# Patient Record
Sex: Male | Born: 1985 | Race: White | Hispanic: No | Smoking: Former smoker
Health system: Southern US, Community
[De-identification: ages and names within clinical notes are randomized; demographics above are authoritative.]

## PROBLEM LIST (undated history)

## (undated) DIAGNOSIS — G4730 Sleep apnea, unspecified: Secondary | ICD-10-CM

## (undated) DIAGNOSIS — R519 Headache, unspecified: Secondary | ICD-10-CM

## (undated) DIAGNOSIS — K219 Gastro-esophageal reflux disease without esophagitis: Secondary | ICD-10-CM

## (undated) DIAGNOSIS — G709 Myoneural disorder, unspecified: Secondary | ICD-10-CM

## (undated) DIAGNOSIS — F419 Anxiety disorder, unspecified: Secondary | ICD-10-CM

## (undated) HISTORY — DX: Myoneural disorder, unspecified: G70.9

## (undated) HISTORY — DX: Anxiety disorder, unspecified: F41.9

## (undated) HISTORY — DX: Sleep apnea, unspecified: G47.30

## (undated) HISTORY — DX: Gastro-esophageal reflux disease without esophagitis: K21.9

## (undated) HISTORY — PX: COLONOSCOPY W/ POLYPECTOMY: SHX1380

## (undated) HISTORY — PX: OTHER SURGICAL HISTORY: SHX169

## (undated) HISTORY — PX: WISDOM TOOTH EXTRACTION: SHX21

---

## 2005-10-21 ENCOUNTER — Emergency Department (HOSPITAL_COMMUNITY): Admission: EM | Admit: 2005-10-21 | Discharge: 2005-10-21 | Payer: Self-pay | Admitting: Family Medicine

## 2020-02-21 ENCOUNTER — Other Ambulatory Visit: Payer: Self-pay

## 2020-02-21 ENCOUNTER — Emergency Department: Payer: Self-pay

## 2020-02-21 ENCOUNTER — Emergency Department
Admission: EM | Admit: 2020-02-21 | Discharge: 2020-02-21 | Disposition: A | Discharge status: Home / Self Care | Payer: Self-pay | Attending: Emergency Medicine | Admitting: Emergency Medicine

## 2020-02-21 ENCOUNTER — Encounter: Payer: Self-pay | Admitting: Emergency Medicine

## 2020-02-21 DIAGNOSIS — S43004A Unspecified dislocation of right shoulder joint, initial encounter: Secondary | ICD-10-CM | POA: Insufficient documentation

## 2020-02-21 DIAGNOSIS — Y9301 Activity, walking, marching and hiking: Secondary | ICD-10-CM | POA: Insufficient documentation

## 2020-02-21 DIAGNOSIS — S43014A Anterior dislocation of right humerus, initial encounter: Secondary | ICD-10-CM

## 2020-02-21 DIAGNOSIS — W010XXA Fall on same level from slipping, tripping and stumbling without subsequent striking against object, initial encounter: Secondary | ICD-10-CM | POA: Insufficient documentation

## 2020-02-21 MED ORDER — ONDANSETRON HCL 4 MG/2ML IJ SOLN
INTRAMUSCULAR | Status: AC
  Administered 2020-02-21: 4 mg via INTRAVENOUS
  Filled 2020-02-21: qty 2

## 2020-02-21 MED ORDER — HYDROMORPHONE HCL 1 MG/ML IJ SOLN
INTRAMUSCULAR | Status: AC
  Administered 2020-02-21: 1 mg via INTRAVENOUS
  Filled 2020-02-21: qty 1

## 2020-02-21 MED ORDER — ONDANSETRON HCL 4 MG/2ML IJ SOLN: 4.0000 mg | Freq: Once | INTRAMUSCULAR | Status: AC

## 2020-02-21 MED ORDER — HYDROMORPHONE HCL 1 MG/ML IJ SOLN: 1.0000 mg | Freq: Once | INTRAMUSCULAR | Status: AC

## 2020-02-21 MED ORDER — NAPROXEN 500 MG PO TABS
500.0000 mg | ORAL_TABLET | Freq: Once | ORAL | Status: AC
  Administered 2020-02-21: 500 mg via ORAL
  Filled 2020-02-21: qty 1

## 2020-02-21 NOTE — ED Provider Notes (Signed)
[facility] Emergency Department Provider Note  ____________________________________________  Time seen: Approximately 1:29 AM  I have reviewed the triage vital signs and the nursing notes.   HISTORY  Chief Complaint Fall    HPI Brett Robinson is a 34 y.o. male with no significant past medical history who was in his usual state of health until this evening when he had a trip and fall walking into his house, onto the right shoulder.  Afterward he had severe pain in the right shoulder which is nonradiating, worse with movement, associated with inability to raise the right arm.  No chest pain or shortness of breath, no head injury or neck pain.  Pain is constant and severe.  No alleviating factors.  He is right-hand dominant.  No history of injury to the shoulder in the past.      History reviewed. No pertinent past medical history.   There are no problems to display for this patient.    History reviewed. No pertinent surgical history.   Prior to Admission medications   Not on File     Allergies Patient has no allergy information on record.   History reviewed. No pertinent family history.  Social History Social History   Tobacco Use  . Smoking status: Never Smoker  . Smokeless tobacco: Never Used  Vaping Use  . Vaping Use: Some days  Substance Use Topics  . Alcohol use: Not on file  . Drug use: Not on file    Review of Systems  Constitutional:   No fever or chills.  ENT:   No sore throat. No rhinorrhea. Cardiovascular:   No chest pain or syncope. Respiratory:   No dyspnea or cough. Gastrointestinal:   Negative for abdominal pain, vomiting and diarrhea.  Musculoskeletal:   Right shoulder pain as above All other systems reviewed and are negative except as documented above in ROS and HPI.  ____________________________________________   PHYSICAL EXAM:  VITAL SIGNS: ED Triage Vitals  Enc Vitals Group     BP 02/21/20 0018  (!) 148/108     Pulse Rate 02/21/20 0018 (!) 107     Resp 02/21/20 0018 18     Temp 02/21/20 0018 98.1 F (36.7 C)     Temp src --      SpO2 02/21/20 0018 98 %     Weight 02/21/20 0019 260 lb (117.9 kg)     Height 02/21/20 0019 6' (1.829 m)     Head Circumference --      Peak Flow --      Pain Score 02/21/20 0019 10     Pain Loc --      Pain Edu? --      Excl. in GC? --     Vital signs reviewed, nursing assessments reviewed.   Constitutional:   Alert and oriented. Non-toxic appearance. Eyes:   Conjunctivae are normal. EOMI. ENT      Head:   Normocephalic and atraumatic.      Mouth/Throat:   MMM      Neck:   No meningismus. Full ROM.  No midline spinal tenderness  Cardiovascular:   RRR. Symmetric bilateral radial pulses.  No murmurs. Cap refill less than 2 seconds. Respiratory:   Normal respiratory effort without tachypnea/retractions. Breath sounds are clear and equal bilaterally. No wheezes/rales/rhonchi. Musculoskeletal: Right arm held in abduction and internal rotation.  There is emptiness over the lateral deltoid, humeral head palpated anterior to the glenoid.  Clavicle stable and nontender, scapula nontender.  Right  humerus contiguous throughout its length and nontender. Neurologic:   Normal speech and language.  Motor grossly intact. Intact distal sensation of the right arm. No acute focal neurologic deficits are appreciated.  Skin:    Skin is warm, dry and intact. No rash noted.  No wounds.  ____________________________________________    LABS (pertinent positives/negatives) (all labs ordered are listed, but only abnormal results are displayed) Labs Reviewed - No data to display ____________________________________________   EKG  ____________________________________________    RADIOLOGY  DG Shoulder Right Portable  Result Date: 02/21/2020 CLINICAL DATA:  Pain EXAM: PORTABLE RIGHT SHOULDER COMPARISON:  None. FINDINGS: There is no evidence of fracture or  dislocation. There is no evidence of arthropathy or other focal bone abnormality. Soft tissues are unremarkable. IMPRESSION: Negative. Electronically Signed   By: Katherine Mantle M.D.   On: 02/21/2020 01:12    ____________________________________________   PROCEDURES .Ortho Injury Treatment  Date/Time: 02/21/2020 1:39 AM Performed by: Sharman Cheek, MD Authorized by: Sharman Cheek, MD   Consent:    Consent obtained:  Verbal   Consent given by:  Patient   Risks discussed:  Fracture, irreducible dislocation, recurrent dislocation and stiffnessInjury location: shoulder Location details: right shoulder Injury type: dislocation Dislocation type: anterior Chronicity: new Pre-procedure neurovascular assessment: neurovascularly intact Pre-procedure distal perfusion: normal Pre-procedure neurological function: normal Pre-procedure range of motion: reduced  Anesthesia: Local anesthesia used: no  Patient sedated: NoManipulation performed: yes Reduction method: external rotation Reduction successful: yes X-ray confirmed reduction: yes Immobilization: sling Post-procedure neurovascular assessment: post-procedure neurovascularly intact Post-procedure distal perfusion: normal Post-procedure neurological function: normal Post-procedure range of motion: normal Patient tolerance: patient tolerated the procedure well with no immediate complications      ____________________________________________  CLINICAL IMPRESSION / ASSESSMENT AND PLAN / ED COURSE  Pertinent labs & imaging results that were available during my care of the patient were reviewed by me and considered in my medical decision making (see chart for details).  Brett Robinson was evaluated in Emergency Department on 02/21/2020 for the symptoms described in the history of present illness. He was evaluated in the context of the global COVID-19 pandemic, which necessitated consideration that the patient might be  at risk for infection with the SARS-CoV-2 virus that causes COVID-19. Institutional protocols and algorithms that pertain to the evaluation of patients at risk for COVID-19 are in a state of rapid change based on information released by regulatory bodies including the CDC and federal and state organizations. These policies and algorithms were followed during the patient's care in the ED.     Clinical Course as of Feb 20 137  Caleen Essex Feb 21, 2020  3664 Patient presents with clinically apparent right anterior shoulder dislocation.  Not suspicious for concurrent fracture.  This was able to be reduced at bedside with external rotation and IV dilaudid 1mg  for pain control. Will obtain xray to confirm position / injury   [PS]  0056 Shoulder x-ray image reviewed by me, shows normal location of the right humeral head without any obvious fracture.   [PS]    Clinical Course User Index [PS] , MD     ----------------------------------------- 1:39 AM on 02/21/2020 -----------------------------------------  Radiology report confirms normal appearance of the right shoulder x-ray after bedside reduction.  Stable for discharge home to follow-up with orthopedics. ____________________________________________   FINAL CLINICAL IMPRESSION(S) / ED DIAGNOSES    Final diagnoses:  Anterior shoulder dislocation, right, initial encounter     ED Discharge Orders    None  Portions of this note were generated with dragon dictation software. Dictation errors may occur despite best attempts at proofreading.   Sharman Cheek, MD 02/21/20 0140

## 2020-02-21 NOTE — Discharge Instructions (Signed)
Keep the sling on at all times except bathing for the next week. Ensure that your arm stays down by your side while sleeping, and avoid raising your arm away from your body or above chest height.  Use ice intermittently on your shoulder for the next 24 hours to reduce swelling and inflammation.  Take aleve (naproxen) 500mg  every 12 hours, or ibuprofen 600mg  every 6 hours for the next 2 days and then as needed after that.
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
## 2020-02-21 NOTE — ED Notes (Signed)
Dr. Scotty Court at bedside attempting to reduce right shoulder.

## 2020-10-26 ENCOUNTER — Emergency Department: Payer: Self-pay

## 2020-10-26 ENCOUNTER — Other Ambulatory Visit: Payer: Self-pay

## 2020-10-26 ENCOUNTER — Emergency Department
Admission: EM | Admit: 2020-10-26 | Discharge: 2020-10-26 | Disposition: A | Discharge status: Home / Self Care | Payer: Self-pay | Attending: Emergency Medicine | Admitting: Emergency Medicine

## 2020-10-26 DIAGNOSIS — R202 Paresthesia of skin: Secondary | ICD-10-CM

## 2020-10-26 DIAGNOSIS — R0602 Shortness of breath: Secondary | ICD-10-CM | POA: Insufficient documentation

## 2020-10-26 DIAGNOSIS — M5412 Radiculopathy, cervical region: Secondary | ICD-10-CM | POA: Insufficient documentation

## 2020-10-26 DIAGNOSIS — R200 Anesthesia of skin: Secondary | ICD-10-CM

## 2020-10-26 LAB — BASIC METABOLIC PANEL
Anion gap: 8 (ref 5–15)
BUN: 13 mg/dL (ref 6–20)
CO2: 25 mmol/L (ref 22–32)
Calcium: 9.4 mg/dL (ref 8.9–10.3)
Chloride: 102 mmol/L (ref 98–111)
Creatinine, Ser: 0.93 mg/dL (ref 0.61–1.24)
GFR, Estimated: >60 mL/min (ref >60)
Glucose, Bld: 130 mg/dL — ABNORMAL HIGH (ref 70–99)
Potassium: 4.1 mmol/L (ref 3.5–5.1)
Sodium: 135 mmol/L (ref 135–145)

## 2020-10-26 LAB — CBC
HCT: 42.4 % (ref 39.0–52.0)
Hemoglobin: 14.8 g/dL (ref 13.0–17.0)
MCH: 30.5 pg (ref 26.0–34.0)
MCHC: 34.9 g/dL (ref 30.0–36.0)
MCV: 87.4 fL (ref 80.0–100.0)
Platelets: 255 10*3/uL (ref 150–400)
RBC: 4.85 MIL/uL (ref 4.22–5.81)
RDW: 12.3 % (ref 11.5–15.5)
WBC: 7 10*3/uL (ref 4.0–10.5)
nRBC: 0 % (ref 0.0–0.2)

## 2020-10-26 LAB — TROPONIN I (HIGH SENSITIVITY): Troponin I (High Sensitivity): 3 ng/L (ref <18)

## 2020-10-26 NOTE — Discharge Instructions (Addendum)
Please use ibuprofen/naproxen for continued neck pain and intermittent extremity numbness

## 2020-10-26 NOTE — ED Notes (Signed)
ED Provider at bedside. 

## 2020-10-26 NOTE — ED Triage Notes (Signed)
Pt c/o intermittent numbness to the left arm into the 4th and 5th finger for 'a while', states in the past 24 hrs having SOB, numbness to other parts of the body, pt is tearful in triage with his concerns.

## 2020-10-26 NOTE — ED Provider Notes (Signed)
[facility] Emergency Department Provider Note   ____________________________________________   Event Date/Time   First MD Initiated Contact with Patient 10/26/20 [phone]     (approximate)  I have reviewed the triage vital signs and the nursing notes.   HISTORY  Chief Complaint Shortness of Breath and Numbness    HPI Brett Robinson is a 35 y.o. male who presents for multiple complaints including neck pain, shortness of breath, and left upper extremity numbness  LOCATION: Neck DURATION: "A while" TIMING: Worsening since onset but intermittent SEVERITY: Moderate QUALITY: Aching CONTEXT: Patient states that over the past few months he has been having neck pain with intermittent left upper extremity numbness in the fourth and fifth digit as well as up his arm.  Patient also states that pain in his neck sometimes radiates up over his head and is associated with facial numbness MODIFYING FACTORS: Denies any exacerbating or relieving factors ASSOCIATED SYMPTOMS: Facial numbness, shortness of breath   Per medical record review, past medical history is noncontributory          History reviewed. No pertinent past medical history.  There are no problems to display for this patient.   Past Surgical History:  Procedure Laterality Date   skin graft     tubes in ears      Prior to Admission medications   Not on File    Allergies Patient has no allergy information on record.  No family history on file.  Social History Social History   Tobacco Use   Smoking status: Never   Smokeless tobacco: Never  Vaping Use   Vaping Use: Some days  Substance Use Topics   Alcohol use: Yes   Drug use: Yes    Types: Marijuana    Review of Systems Constitutional: No fever/chills Eyes: No visual changes. ENT: No sore throat. Cardiovascular: Denies chest pain. Respiratory: Endorses intermittent shortness of breath. Gastrointestinal: No abdominal  pain.  No nausea, no vomiting.  No diarrhea. Genitourinary: Negative for dysuria. Musculoskeletal: Negative for acute arthralgias Skin: Negative for rash. Neurological: Endorses intermittent headaches and paresthesias/numbness in left upper extremity as well as face Psychiatric: Negative for suicidal ideation/homicidal ideation   ____________________________________________   PHYSICAL EXAM:  VITAL SIGNS: ED Triage Vitals  Enc Vitals Group     BP 10/26/20 0835 124/66     Pulse Rate 10/26/20 0835 65     Resp 10/26/20 0835 18     Temp 10/26/20 0835 98.7 F (37.1 C)     Temp src --      SpO2 10/26/20 0835 100 %     Weight --      Height --      Head Circumference --      Peak Flow --      Pain Score 10/26/20 0831 0     Pain Loc --      Pain Edu? --      Excl. in GC? --    Constitutional: Alert and oriented. Well appearing and in no acute distress. Eyes: Conjunctivae are normal. PERRL. Head: Atraumatic. Nose: No congestion/rhinnorhea. Mouth/Throat: Mucous membranes are moist. Neck: No stridor.  Positive Spurling test on the left Cardiovascular: Grossly normal heart sounds.  Good peripheral circulation. Respiratory: Normal respiratory effort.  No retractions. Gastrointestinal: Soft and nontender. No distention. Musculoskeletal: No obvious deformities Neurologic:  Normal speech and language. No gross focal neurologic deficits are appreciated. Skin:  Skin is warm and dry. No rash noted. Psychiatric: Mood and affect are  normal. Speech and behavior are normal.  ____________________________________________   LABS (all labs ordered are listed, but only abnormal results are displayed)  Labs Reviewed  BASIC METABOLIC PANEL - Abnormal; Notable for the following components:      Result Value   Glucose, Bld 130 (*)    All other components within normal limits  CBC  TROPONIN I (HIGH SENSITIVITY)  TROPONIN I (HIGH SENSITIVITY)    ____________________________________________  EKG  ED ECG REPORT I, Merwyn Katos, the attending physician, personally viewed and interpreted this ECG.  Date: 10/26/2020 EKG Time: 0830 Rate: 67 Rhythm: normal sinus rhythm QRS Axis: normal Intervals: normal ST/T Wave abnormalities: normal Narrative Interpretation: no evidence of acute ischemia  ____________________________________________  RADIOLOGY  ED MD interpretation: 2 view chest x-ray shows no evidence of acute abnormalities including no pneumonia, pneumothorax, or widened mediastinum  Official radiology report(s): DG Chest 2 View  Result Date: 10/26/2020 CLINICAL DATA:  Shortness of breath. EXAM: CHEST - 2 VIEW COMPARISON:  None. FINDINGS: The lungs are clear without focal pneumonia, edema, pneumothorax or pleural effusion. The cardiopericardial silhouette is within normal limits for size. The visualized bony structures of the thorax show no acute abnormality. IMPRESSION: No active cardiopulmonary disease. Electronically Signed   By: Kennith Center M.D.   On: 10/26/2020 09:03    ____________________________________________   PROCEDURES  Procedure(s) performed (including Critical Care):  .1-3 Lead EKG Interpretation  Date/Time: 10/26/2020 10:10 AM Performed by: Merwyn Katos, MD Authorized by: Merwyn Katos, MD     Interpretation: normal     ECG rate:  62   ECG rate assessment: normal     Rhythm: sinus rhythm     Ectopy: none     Conduction: normal     ____________________________________________   INITIAL IMPRESSION / ASSESSMENT AND PLAN / ED COURSE  As part of my medical decision making, I reviewed the following data within the electronic medical record, if available:  Nursing notes reviewed and incorporated, Labs reviewed, EKG interpreted, Old chart reviewed, Radiograph reviewed and Notes from prior ED visits reviewed and incorporated        + neck pain [+ sensation of paresthesias and  numbness.]  ED Workup: Defer C-Spine imaging given negative by NEXUS criteria  Given History, Exam the patient appears to have a cervical radiculopathy.   Patient appears to be low risk for complications or other emergent conditions such as  anginal equivalent, frank cervical instability, arterial dissection, osteomyelitis, epidural abscess, central cord syndrome, c-spine fracture, other spinal emergencies  Rx: NSAIDs, outpatient physical therapy evaluation and recommendation for home exercises in the interim Disposition: Discharge. The patient has been given strict return precautions and understands the need to follow up within 48 hours with their primary care provider      ____________________________________________   FINAL CLINICAL IMPRESSION(S) / ED DIAGNOSES  Final diagnoses:  Shortness of breath  Cervical radiculopathy  Numbness and tingling in left arm  Left facial numbness     ED Discharge Orders     None        Note:  This document was prepared using Dragon voice recognition software and may include unintentional dictation errors.    Merwyn Katos, MD 10/26/20 1010

## 2020-10-30 ENCOUNTER — Other Ambulatory Visit: Payer: Self-pay

## 2020-10-30 ENCOUNTER — Emergency Department: Payer: Self-pay

## 2020-10-30 ENCOUNTER — Emergency Department
Admission: EM | Admit: 2020-10-30 | Discharge: 2020-10-30 | Disposition: A | Discharge status: Home / Self Care | Payer: Self-pay | Attending: Emergency Medicine | Admitting: Emergency Medicine

## 2020-10-30 ENCOUNTER — Encounter: Payer: Self-pay | Admitting: Emergency Medicine

## 2020-10-30 DIAGNOSIS — R519 Headache, unspecified: Secondary | ICD-10-CM | POA: Insufficient documentation

## 2020-10-30 DIAGNOSIS — M5412 Radiculopathy, cervical region: Secondary | ICD-10-CM | POA: Insufficient documentation

## 2020-10-30 DIAGNOSIS — R202 Paresthesia of skin: Secondary | ICD-10-CM | POA: Insufficient documentation

## 2020-10-30 DIAGNOSIS — R42 Dizziness and giddiness: Secondary | ICD-10-CM | POA: Insufficient documentation

## 2020-10-30 LAB — BASIC METABOLIC PANEL
Anion gap: 9 (ref 5–15)
BUN: 18 mg/dL (ref 6–20)
CO2: 25 mmol/L (ref 22–32)
Calcium: 9.7 mg/dL (ref 8.9–10.3)
Chloride: 104 mmol/L (ref 98–111)
Creatinine, Ser: 0.91 mg/dL (ref 0.61–1.24)
GFR, Estimated: >60 mL/min (ref >60)
Glucose, Bld: 113 mg/dL — ABNORMAL HIGH (ref 70–99)
Potassium: 3.8 mmol/L (ref 3.5–5.1)
Sodium: 138 mmol/L (ref 135–145)

## 2020-10-30 LAB — CBC
HCT: 42.2 % (ref 39.0–52.0)
Hemoglobin: 14.7 g/dL (ref 13.0–17.0)
MCH: 30.3 pg (ref 26.0–34.0)
MCHC: 34.8 g/dL (ref 30.0–36.0)
MCV: 87 fL (ref 80.0–100.0)
Platelets: 253 10*3/uL (ref 150–400)
RBC: 4.85 MIL/uL (ref 4.22–5.81)
RDW: 11.9 % (ref 11.5–15.5)
WBC: 6.4 10*3/uL (ref 4.0–10.5)
nRBC: 0 % (ref 0.0–0.2)

## 2020-10-30 MED ORDER — KETOROLAC TROMETHAMINE 60 MG/2ML IM SOLN
60.0000 mg | Freq: Once | INTRAMUSCULAR | Status: AC
  Administered 2020-10-30: 60 mg via INTRAMUSCULAR
  Filled 2020-10-30: qty 2

## 2020-10-30 MED ORDER — DEXAMETHASONE 6 MG PO TABS
10.0000 mg | ORAL_TABLET | Freq: Once | ORAL | Status: AC
  Administered 2020-10-30: 10 mg via ORAL
  Filled 2020-10-30: qty 1

## 2020-10-30 MED ORDER — PROCHLORPERAZINE EDISYLATE 10 MG/2ML IJ SOLN
10.0000 mg | Freq: Once | INTRAMUSCULAR | Status: AC
  Administered 2020-10-30: 10 mg via INTRAMUSCULAR
  Filled 2020-10-30: qty 2

## 2020-10-30 MED ORDER — DIPHENHYDRAMINE HCL 50 MG/ML IJ SOLN
25.0000 mg | Freq: Once | INTRAMUSCULAR | Status: AC
  Administered 2020-10-30: 25 mg via INTRAMUSCULAR
  Filled 2020-10-30: qty 1

## 2020-10-30 MED ORDER — METHYLPREDNISOLONE 4 MG PO TBPK: ORAL_TABLET | ORAL | 0 refills | Status: DC

## 2020-10-30 MED ORDER — CYCLOBENZAPRINE HCL 10 MG PO TABS: 10.0000 mg | ORAL_TABLET | Freq: Three times a day (TID) | ORAL | 0 refills | Status: DC | PRN

## 2020-10-30 MED ORDER — IBUPROFEN 600 MG PO TABS: 600.0000 mg | ORAL_TABLET | Freq: Three times a day (TID) | ORAL | 0 refills | Status: DC | PRN

## 2020-10-30 NOTE — ED Provider Notes (Signed)
[facility] Emergency Department Provider Note  ____________________________________________   Event Date/Time   First MD Initiated Contact with Patient 10/30/20 [phone]     (approximate)  I have reviewed the triage vital signs and the nursing notes.   HISTORY  Chief Complaint Dizziness    HPI Brett Robinson is a 35 y.o. male here with intermittent left sided numbness and occasional right arm numbness.  The patient states that for the last several months, he has had progressively worsening intermittent, numbness shooting down his left arm along the ulnar aspect of it.  This is been progressively worsening.  He has some occasional associated upper neck pain with this.  He is also noticed some occasional radiation down to his leg, as well as some transient symptoms in his right arm.  He also has associated moderate, generalized, throbbing, headache.  He states he has had some dizziness as well, though he has been able to walk and has had no nausea or vomiting.  Denies any head trauma.  No history of migraines, though he does have a history of fairly regular headaches but has not seen a neurologist for this.  No vision changes.  No personal family history of multiple sclerosis.  No recent medication changes.  No other complaints.    History reviewed. No pertinent past medical history.  There are no problems to display for this patient.   Past Surgical History:  Procedure Laterality Date   skin graft     tubes in ears      Prior to Admission medications   Medication Sig Start Date End Date Taking? Authorizing Provider  cyclobenzaprine (FLEXERIL) 10 MG tablet Take 1 tablet (10 mg total) by mouth 3 (three) times daily as needed for muscle spasms. 10/30/20  Yes Shaune Pollack, MD  ibuprofen (ADVIL) 600 MG tablet Take 1 tablet (600 mg total) by mouth every 8 (eight) hours as needed for moderate pain. 10/30/20  Yes Shaune Pollack, MD  methylPREDNISolone (MEDROL  DOSEPAK) 4 MG TBPK tablet Take as directed (21 tab pack). 10/30/20  Yes Shaune Pollack, MD    Allergies Patient has no known allergies.  No family history on file.  Social History Social History   Tobacco Use   Smoking status: Never   Smokeless tobacco: Never  Vaping Use   Vaping Use: Some days  Substance Use Topics   Alcohol use: Yes   Drug use: Yes    Types: Marijuana    Review of Systems  Review of Systems  Constitutional:  Positive for fatigue. Negative for chills and fever.  HENT:  Negative for sore throat.   Respiratory:  Negative for shortness of breath.   Cardiovascular:  Negative for chest pain.  Gastrointestinal:  Negative for abdominal pain.  Genitourinary:  Negative for flank pain.  Musculoskeletal:  Positive for neck pain and neck stiffness.  Skin:  Negative for rash and wound.  Allergic/Immunologic: Negative for immunocompromised state.  Neurological:  Positive for weakness and numbness.  Hematological:  Does not bruise/bleed easily.    ____________________________________________  PHYSICAL EXAM:      VITAL SIGNS: ED Triage Vitals  Enc Vitals Group     BP 10/30/20 0543 131/82     Pulse Rate 10/30/20 0543 62     Resp 10/30/20 0543 16     Temp 10/30/20 0543 98.8 F (37.1 C)     Temp Source 10/30/20 0543 Oral     SpO2 10/30/20 0543 98 %     Weight 10/30/20  0559 260 lb (117.9 kg)     Height 10/30/20 0559 6' (1.829 m)     Head Circumference --      Peak Flow --      Pain Score 10/30/20 0559 6     Pain Loc --      Pain Edu? --      Excl. in GC? --      Physical Exam Vitals and nursing note reviewed.  Constitutional:      General: He is not in acute distress.    Appearance: He is well-developed.  HENT:     Head: Normocephalic and atraumatic.  Eyes:     Conjunctiva/sclera: Conjunctivae normal.  Cardiovascular:     Rate and Rhythm: Normal rate and regular rhythm.     Heart sounds: Normal heart sounds. No murmur heard.   No friction rub.   Pulmonary:     Effort: Pulmonary effort is normal. No respiratory distress.     Breath sounds: Normal breath sounds. No wheezing or rales.  Abdominal:     General: There is no distension.     Palpations: Abdomen is soft.     Tenderness: There is no abdominal tenderness.  Musculoskeletal:     Cervical back: Neck supple.  Skin:    General: Skin is warm.     Capillary Refill: Capillary refill takes less than 2 seconds.  Neurological:     Mental Status: He is alert and oriented to person, place, and time.     Motor: No abnormal muscle tone.     Comments: Strength out of 5 bilateral upper and lower extremities proximally and distally.  Grip strength intact.  Normal sensation to light touch bilateral upper and lower extremities.  Cranial nerves intact.  No nystagmus.  Finger-to-nose testing normal.  Gait normal.  Negative Romberg.      ____________________________________________   LABS (all labs ordered are listed, but only abnormal results are displayed)  Labs Reviewed  BASIC METABOLIC PANEL - Abnormal; Notable for the following components:      Result Value   Glucose, Bld 113 (*)    All other components within normal limits  CBC    ____________________________________________  EKG: Sinus bradycardia with sinus arrhythmia.  Ventricular rate 56, PR 160, QRS 104, QTc 372.  No acute ST elevations or depressions. ________________________________________  RADIOLOGY All imaging, including plain films, CT scans, and ultrasounds, independently reviewed by me, and interpretations confirmed via formal radiology reads.  ED MD interpretation:   CT head: Normal head CT CT cervical spine: Straightening of the cervical lordosis likely due to muscle spasm, degenerative disc disease at C4-C5 and C6-C7  Official radiology report(s): CT Cervical Spine Wo Contrast  Result Date: 10/30/2020 CLINICAL DATA:  Cervical radiculopathy, no red flags EXAM: CT CERVICAL SPINE WITHOUT CONTRAST TECHNIQUE:  Multidetector CT imaging of the cervical spine was performed without intravenous contrast. Multiplanar CT image reconstructions were also generated. COMPARISON:  None. FINDINGS: Alignment: Facet joints are aligned without dislocation or traumatic listhesis. Dens and lateral masses are aligned. Straightening with slight reversal of the cervical lordosis. Skull base and vertebrae: No acute fracture. No primary bone lesion or focal pathologic process. Soft tissues and spinal canal: No prevertebral fluid or swelling. No visible canal hematoma. Disc levels: Intervertebral disc heights are preserved. Mild endplate and uncovertebral joint spurring at the C4-5 and C6-7 levels. There is a spur projecting into the right C4-5 neural foramen (series 6, image 27). No significant facet joint arthropathy. Upper chest: Included lung  apices are clear. Other: 1.6 cm right thyroid lobe nodule. IMPRESSION: 1. No acute fracture or traumatic listhesis of the cervical spine. 2. Straightening with slight reversal of the cervical lordosis may be positional or secondary to muscle spasm. 3. Mild degenerative disc disease at C4-5 and C6-7 levels. Suspected right-sided foraminal stenosis at the C4-5 level secondary to uncovertebral spurring. 4. Incidentally noted 1.6 cm right thyroid lobe nodule. Recommend nonemergent thyroid US (ref: J Am Coll Radiol. 2015 Feb;12(2): 143-50). Electronically Signed   By: Duanne GuessNicholas  Plundo D.O.   On: 10/30/2020 10:45    ____________________________________________  PROCEDURES   Procedure(s) performed (including Critical Care):  Procedures  ____________________________________________  INITIAL IMPRESSION / MDM / ASSESSMENT AND PLAN / ED COURSE  As part of my medical decision making, I reviewed the following data within the electronic MEDICAL RECORD NUMBER Nursing notes reviewed and incorporated, Old chart reviewed, Notes from prior ED visits, and  Controlled Substance Database       *Brett DikeChristopher  Robinson was evaluated in Emergency Department on 10/30/2020 for the symptoms described in the history of present illness. He was evaluated in the context of the global COVID-19 pandemic, which necessitated consideration that the patient might be at risk for infection with the SARS-CoV-2 virus that causes COVID-19. Institutional protocols and algorithms that pertain to the evaluation of patients at risk for COVID-19 are in a state of rapid change based on information released by regulatory bodies including the CDC and federal and state organizations. These policies and algorithms were followed during the patient's care in the ED.  Some ED evaluations and interventions may be delayed as a result of limited staffing during the pandemic.*     Medical Decision Making: 35 year old male here with left arm numbness and transient right hand symptoms.  Suspect cervical radiculopathy, differential includes atypical migraine, peripheral neuropathy, anxiety.  Patient has no weakness or signs to suggest significant cord compromise.  He has no focal neurological deficits to suggest significant intracranial mass or lesion.  That being said, given the persistence of his symptoms and absence of any previous imaging, will obtain a CT of the head and C-spine.  Will treat for possible complex migraine.  Patient is otherwise afebrile and hemodynamically stable.  No leukocytosis or signs of infectious etiology.  CT of the cervical spine shows degenerative disease of C4-C5 and C6-C7, which fits clinically with his symptoms.  He may also have a component of muscle spasm.  I discussed this with him and encouraged follow-up with outpatient neurosurgery.  Incidental thyroid nodule noted, will recommend follow-up for this as well.  Otherwise, patient will be treated with steroids, analgesia, and outpatient follow-up.  No weakness or signs of impending significant cord compromise at this  time.  ____________________________________________  FINAL CLINICAL IMPRESSION(S) / ED DIAGNOSES  Final diagnoses:  Dizziness  Cervical radiculopathy     MEDICATIONS GIVEN DURING THIS VISIT:  Medications  ketorolac (TORADOL) injection 60 mg (60 mg Intramuscular Given 10/30/20 1025)  prochlorperazine (COMPAZINE) injection 10 mg (10 mg Intramuscular Given 10/30/20 1026)  diphenhydrAMINE (BENADRYL) injection 25 mg (25 mg Intramuscular Given 10/30/20 1026)  dexamethasone (DECADRON) tablet 10 mg (10 mg Oral Given 10/30/20 1026)     ED Discharge Orders          Ordered    methylPREDNISolone (MEDROL DOSEPAK) 4 MG TBPK tablet        10/30/20 1123    ibuprofen (ADVIL) 600 MG tablet  Every 8 hours PRN  10/30/20 1123    cyclobenzaprine (FLEXERIL) 10 MG tablet  3 times daily PRN        10/30/20 1123             Note:  This document was prepared using Dragon voice recognition software and may include unintentional dictation errors.   Shaune Pollack, MD 10/30/20 [phone]

## 2020-10-30 NOTE — ED Notes (Signed)
Reviewed pt's cc and prev visit with Dr York Cerise; verbal orders obtained

## 2020-10-30 NOTE — ED Notes (Signed)
PT reports being seen recently and was told he had a pinched nerve.  Pt states headaches continue and that numbness has spread.  Pt has not had a follow up since being seen in ED.

## 2020-10-30 NOTE — ED Triage Notes (Signed)
Patient ambulatory to triage with steady gait, without difficulty or distress noted; pt reports occipital HA accomp by nausea and dizziness and "tingling" to left side of body that has been going on seen last wk; seen here several days ago with no abnormal findings

## 2020-11-25 DIAGNOSIS — G589 Mononeuropathy, unspecified: Secondary | ICD-10-CM | POA: Insufficient documentation

## 2020-12-08 ENCOUNTER — Other Ambulatory Visit: Payer: Self-pay | Admitting: Neurosurgery

## 2020-12-08 ENCOUNTER — Other Ambulatory Visit (HOSPITAL_COMMUNITY): Payer: Self-pay | Admitting: Neurosurgery

## 2020-12-08 DIAGNOSIS — M5412 Radiculopathy, cervical region: Secondary | ICD-10-CM

## 2020-12-08 DIAGNOSIS — R42 Dizziness and giddiness: Secondary | ICD-10-CM

## 2020-12-08 DIAGNOSIS — R519 Headache, unspecified: Secondary | ICD-10-CM

## 2020-12-15 ENCOUNTER — Emergency Department
Admission: EM | Admit: 2020-12-15 | Discharge: 2020-12-15 | Disposition: A | Discharge status: Home / Self Care | Payer: Self-pay | Attending: Emergency Medicine | Admitting: Emergency Medicine

## 2020-12-15 ENCOUNTER — Emergency Department: Payer: Self-pay

## 2020-12-15 DIAGNOSIS — R079 Chest pain, unspecified: Secondary | ICD-10-CM | POA: Insufficient documentation

## 2020-12-15 DIAGNOSIS — R42 Dizziness and giddiness: Secondary | ICD-10-CM | POA: Insufficient documentation

## 2020-12-15 LAB — BASIC METABOLIC PANEL
Anion gap: 9 (ref 5–15)
BUN: 15 mg/dL (ref 6–20)
CO2: 24 mmol/L (ref 22–32)
Calcium: 9.7 mg/dL (ref 8.9–10.3)
Chloride: 102 mmol/L (ref 98–111)
Creatinine, Ser: 0.89 mg/dL (ref 0.61–1.24)
GFR, Estimated: >60 mL/min (ref >60)
Glucose, Bld: 118 mg/dL — ABNORMAL HIGH (ref 70–99)
Potassium: 4.1 mmol/L (ref 3.5–5.1)
Sodium: 135 mmol/L (ref 135–145)

## 2020-12-15 LAB — URINALYSIS, COMPLETE (UACMP) WITH MICROSCOPIC
Bacteria, UA: NONE SEEN
Bilirubin Urine: NEGATIVE
Glucose, UA: NEGATIVE mg/dL
Ketones, ur: NEGATIVE mg/dL
Leukocytes,Ua: NEGATIVE
Nitrite: NEGATIVE
Protein, ur: NEGATIVE mg/dL
Specific Gravity, Urine: <1.005 — ABNORMAL LOW (ref 1.005–1.030)
Squamous Epithelial / HPF: NONE SEEN (ref 0–5)
pH: 5.5 (ref 5.0–8.0)

## 2020-12-15 LAB — CBC
HCT: 42.4 % (ref 39.0–52.0)
Hemoglobin: 15.8 g/dL (ref 13.0–17.0)
MCH: 31.7 pg (ref 26.0–34.0)
MCHC: 37.3 g/dL — ABNORMAL HIGH (ref 30.0–36.0)
MCV: 85 fL (ref 80.0–100.0)
Platelets: 294 10*3/uL (ref 150–400)
RBC: 4.99 MIL/uL (ref 4.22–5.81)
RDW: 12.3 % (ref 11.5–15.5)
WBC: 9.9 10*3/uL (ref 4.0–10.5)
nRBC: 0 % (ref 0.0–0.2)

## 2020-12-15 MED ORDER — METOCLOPRAMIDE HCL 10 MG PO TABS
10.0000 mg | ORAL_TABLET | Freq: Once | ORAL | Status: AC
  Administered 2020-12-15: 10 mg via ORAL
  Filled 2020-12-15: qty 1

## 2020-12-15 MED ORDER — MECLIZINE HCL 25 MG PO TABS: 25.0000 mg | ORAL_TABLET | Freq: Three times a day (TID) | ORAL | 0 refills | Status: AC | PRN

## 2020-12-15 MED ORDER — KETOROLAC TROMETHAMINE 30 MG/ML IJ SOLN
15.0000 mg | Freq: Once | INTRAMUSCULAR | Status: AC
  Administered 2020-12-15: 15 mg via INTRAMUSCULAR
  Filled 2020-12-15: qty 1

## 2020-12-15 MED ORDER — MECLIZINE HCL 25 MG PO TABS
25.0000 mg | ORAL_TABLET | Freq: Once | ORAL | Status: AC
  Administered 2020-12-15: 25 mg via ORAL
  Filled 2020-12-15: qty 1

## 2020-12-15 NOTE — ED Provider Notes (Signed)
[facility]  ____________________________________________   Event Date/Time   First MD Initiated Contact with Patient 12/15/20 1645     (approximate)  I have reviewed the triage vital signs and the nursing notes.   HISTORY  Chief Complaint Dizziness (Chest pain/)    HPI Brett Robinson is a 35 y.o. male with no past medical history who presents with ongoing dizziness and intermittent chest pain.  Patient symptoms started over a month ago.  His main complaint is the dizziness.  He describes it as a lightheaded feeling with occasional room spinning sensation.  He notes that it is constant and since several weeks ago has never totally resolved.  It does however become exacerbated when he sits down and when he moves his head.  Says it actually feels better when he gets up and walks around.  He has had associated posterior headache that is intermittent.  He also has intermittent blurry vision and notes that his vision is blurry constantly today.  He denies diplopia, dysarthria or dysphagia.  He has had some intermittent left arm numbness which has been attributed to a cervical radiculopathy he has actually seen neurosurgery about this.  He notes that he feels globally weak and occasionally his whole body feels numb but he denies any new focal numbness or weakness.  He also denies any bowel or bladder difficulty including incontinence or retention.  He also has had intermittent sharp chest pain.  It is not associated with exertion.  It often comes on when his dizziness comes on.  At the same time he also gets dyspnea which is not always associate with the chest pain.  Denies any fevers or chills.  Patient has been seen in the ED twice for the symptoms last month and had a CT of his head, labs including troponin that are normal.  He followed up with his primary care provider who referred him to both neurosurgery and ENT.  He has not yet seen ENT.  He saw neurosurgery who  recommended both an MRI of his brain and C-spine.  He is scheduled to get the MRI of his brain 2 days from now on Thursday of this week.         History reviewed. No pertinent past medical history.  There are no problems to display for this patient.   Past Surgical History:  Procedure Laterality Date   skin graft     tubes in ears      Prior to Admission medications   Medication Sig Start Date End Date Taking? Authorizing Provider  meclizine (ANTIVERT) 25 MG tablet Take 1 tablet (25 mg total) by mouth 3 (three) times daily as needed for up to 10 days for dizziness. 12/15/20 12/25/20 Yes Georga Hacking, MD  cyclobenzaprine (FLEXERIL) 10 MG tablet Take 1 tablet (10 mg total) by mouth 3 (three) times daily as needed for muscle spasms. 10/30/20   Shaune Pollack, MD  ibuprofen (ADVIL) 600 MG tablet Take 1 tablet (600 mg total) by mouth every 8 (eight) hours as needed for moderate pain. 10/30/20   Shaune Pollack, MD  methylPREDNISolone (MEDROL DOSEPAK) 4 MG TBPK tablet Take as directed (21 tab pack). 10/30/20   Shaune Pollack, MD    Allergies Patient has no known allergies.  History reviewed. No pertinent family history.  Social History Social History   Tobacco Use   Smoking status: Never   Smokeless tobacco: Never  Vaping Use   Vaping Use: Some days  Substance Use Topics  Alcohol use: Yes   Drug use: Not Currently    Review of Systems   Review of Systems  Constitutional:  Negative for chills and fever.  Eyes:  Positive for visual disturbance.  Respiratory:  Positive for shortness of breath.   Cardiovascular:  Positive for chest pain.  Genitourinary:  Negative for difficulty urinating.  Musculoskeletal:  Positive for neck pain. Negative for back pain.  Neurological:  Positive for numbness and headaches. Negative for weakness.  All other systems reviewed and are negative.  Physical Exam Updated Vital Signs BP 132/84   Pulse 89   Temp 97.6 F (36.4 C) (Oral)    Resp 18   SpO2 97%   Physical Exam Vitals and nursing note reviewed.  Constitutional:      General: He is not in acute distress.    Appearance: Normal appearance.  HENT:     Head: Normocephalic and atraumatic.  Eyes:     General: No scleral icterus.    Conjunctiva/sclera: Conjunctivae normal.  Pulmonary:     Effort: Pulmonary effort is normal. No respiratory distress.     Breath sounds: Normal breath sounds. No wheezing.  Musculoskeletal:        General: No deformity or signs of injury.     Cervical back: Normal range of motion.  Skin:    Coloration: Skin is not jaundiced or pale.  Neurological:     General: No focal deficit present.     Mental Status: He is alert and oriented to person, place, and time. Mental status is at baseline.     Comments: Visual acuity is 20/20 in the left and 20/25 in the right Extraocular movements intact, face is symmetric, normal tongue movement 5 out of 5 strength in the bilateral upper and lower extremities Sensation grossly normal in the bilateral upper extremities Finger-nose-finger intact Gait is normal without ataxia   Psychiatric:        Mood and Affect: Mood normal.        Behavior: Behavior normal.     LABS (all labs ordered are listed, but only abnormal results are displayed)  Labs Reviewed  BASIC METABOLIC PANEL - Abnormal; Notable for the following components:      Result Value   Glucose, Bld 118 (*)    All other components within normal limits  CBC - Abnormal; Notable for the following components:   MCHC 37.3 (*)    All other components within normal limits  URINALYSIS, COMPLETE (UACMP) WITH MICROSCOPIC - Abnormal; Notable for the following components:   Color, Urine STRAW (*)    Specific Gravity, Urine <1.005 (*)    Hgb urine dipstick TRACE (*)    All other components within normal limits  CBG MONITORING, ED   ____________________________________________  EKG  Normal sinus rhythm, normal intervals, normal axis, no  acute ischemic changes ____________________________________________  RADIOLOGY I, Randol Kern, personally viewed and evaluated these images (plain radiographs) as part of my medical decision making, as well as reviewing the written report by the radiologist.  ED MD interpretation:  n/a    ____________________________________________   PROCEDURES  Procedure(s) performed (including Critical Care):  Procedures   ____________________________________________   INITIAL IMPRESSION / ASSESSMENT AND PLAN / ED COURSE     The patient is a 35 year old male who presents with multiple symptoms which have been going on for over a month.  The thing that is most concerning to him is the dizziness.  It is been constant, somewhat positional but also described both  as lightheadedness and spinning feeling.  He has associated symptoms of blurry vision but no other cranial nerve findings including diplopia dysarthria etc.  He is scheduled to see ENT which I think is appropriate given the associated tinnitus that was noted before.  Patient also was scheduled for an MRI in 2 days which I also think is appropriate to rule out any central cause of his dizziness, such as MS or other central pathology.  I think that his left arm weakness is unrelated and is probably due to the cervical radiculopathy for which she is seeing neurosurgery.  Today his exam is normal he has no concerning nystagmus, normal cerebellar exam including finger-nose and gait and has no objective weakness therefore I do not feel that we need to obtain an emergent MRI today.  I am comforted by the fact that he has an MRI in 2 days and given this has been going on for several weeks I do not feel that delaying a potential diagnosis by 2 days is significant.  I am less concerned about his chest pain and dyspnea given his normal EKG and prior negative work-up as well as symptoms being atypical with normal vital signs.    Patient was given  meclizine as well as Reglan and Toradol and his symptoms improved.  We discussed following up for his MRI as scheduled as well as following up with neurology neurosurgery and ENT.   ____________________________________________   FINAL CLINICAL IMPRESSION(S) / ED DIAGNOSES  Final diagnoses:  Dizziness     ED Discharge Orders          Ordered    meclizine (ANTIVERT) 25 MG tablet  3 times daily PRN        12/15/20 1914             Note:  This document was prepared using Dragon voice recognition software and may include unintentional dictation errors.    Georga Hacking, MD 12/15/20 [id number]

## 2020-12-15 NOTE — Discharge Instructions (Addendum)
Please follow-up with neurosurgery and ENT.  You should also follow-up with neurology.  Please get the MRI of your brain and spine done on Thursday as scheduled.

## 2020-12-15 NOTE — ED Triage Notes (Signed)
Pt presents to ED with c/o of of dizziness and chest pain that has been ongoing for 1 month pt states he has been seen here twice for this. Pt states no PCP.   Pt states new tenderness to L upper inner thigh.   Pt also states blurry vision that was intermittent last night but states the burry vision has stayed since this morning.

## 2020-12-17 ENCOUNTER — Ambulatory Visit
Admission: RE | Admit: 2020-12-17 | Discharge: 2020-12-17 | Disposition: A | Discharge status: Home / Self Care | Payer: Self-pay | Source: Ambulatory Visit | Attending: Neurosurgery | Admitting: Neurosurgery

## 2020-12-17 ENCOUNTER — Other Ambulatory Visit: Payer: Self-pay

## 2020-12-17 DIAGNOSIS — R519 Headache, unspecified: Secondary | ICD-10-CM

## 2020-12-17 DIAGNOSIS — R42 Dizziness and giddiness: Secondary | ICD-10-CM

## 2020-12-17 DIAGNOSIS — M5412 Radiculopathy, cervical region: Secondary | ICD-10-CM | POA: Insufficient documentation

## 2021-02-09 DIAGNOSIS — M940 Chondrocostal junction syndrome [Tietze]: Secondary | ICD-10-CM | POA: Insufficient documentation

## 2021-02-16 ENCOUNTER — Emergency Department: Payer: Self-pay

## 2021-02-16 ENCOUNTER — Encounter: Payer: Self-pay | Admitting: Emergency Medicine

## 2021-02-16 ENCOUNTER — Emergency Department
Admission: EM | Admit: 2021-02-16 | Discharge: 2021-02-16 | Disposition: A | Discharge status: Home / Self Care | Payer: Self-pay | Attending: Emergency Medicine | Admitting: Emergency Medicine

## 2021-02-16 ENCOUNTER — Other Ambulatory Visit: Payer: Self-pay

## 2021-02-16 DIAGNOSIS — R300 Dysuria: Secondary | ICD-10-CM | POA: Insufficient documentation

## 2021-02-16 DIAGNOSIS — R109 Unspecified abdominal pain: Secondary | ICD-10-CM | POA: Insufficient documentation

## 2021-02-16 DIAGNOSIS — R609 Edema, unspecified: Secondary | ICD-10-CM | POA: Insufficient documentation

## 2021-02-16 LAB — CBC WITH DIFFERENTIAL/PLATELET
Abs Immature Granulocytes: 0.02 10*3/uL (ref 0.00–0.07)
Basophils Absolute: 0 10*3/uL (ref 0.0–0.1)
Basophils Relative: 0 %
Eosinophils Absolute: 0.1 10*3/uL (ref 0.0–0.5)
Eosinophils Relative: 1 %
HCT: 41.9 % (ref 39.0–52.0)
Hemoglobin: 14.9 g/dL (ref 13.0–17.0)
Immature Granulocytes: 0 %
Lymphocytes Relative: 19 %
Lymphs Abs: 1.8 10*3/uL (ref 0.7–4.0)
MCH: 31.4 pg (ref 26.0–34.0)
MCHC: 35.6 g/dL (ref 30.0–36.0)
MCV: 88.2 fL (ref 80.0–100.0)
Monocytes Absolute: 0.5 10*3/uL (ref 0.1–1.0)
Monocytes Relative: 5 %
Neutro Abs: 6.9 10*3/uL (ref 1.7–7.7)
Neutrophils Relative %: 75 %
Platelets: 204 10*3/uL (ref 150–400)
RBC: 4.75 MIL/uL (ref 4.22–5.81)
RDW: 11.9 % (ref 11.5–15.5)
WBC: 9.4 10*3/uL (ref 4.0–10.5)
nRBC: 0 % (ref 0.0–0.2)

## 2021-02-16 LAB — URINALYSIS, COMPLETE (UACMP) WITH MICROSCOPIC
Bacteria, UA: NONE SEEN
Bilirubin Urine: NEGATIVE
Glucose, UA: NEGATIVE mg/dL
Ketones, ur: NEGATIVE mg/dL
Leukocytes,Ua: NEGATIVE
Nitrite: NEGATIVE
Protein, ur: NEGATIVE mg/dL
Specific Gravity, Urine: 1.01 (ref 1.005–1.030)
pH: 5 (ref 5.0–8.0)

## 2021-02-16 LAB — HEPATIC FUNCTION PANEL
ALT: 25 U/L (ref 0–44)
AST: 20 U/L (ref 15–41)
Albumin: 5 g/dL (ref 3.5–5.0)
Alkaline Phosphatase: 64 U/L (ref 38–126)
Bilirubin, Direct: 0.2 mg/dL (ref 0.0–0.2)
Indirect Bilirubin: 0.8 mg/dL (ref 0.3–0.9)
Total Bilirubin: 1 mg/dL (ref 0.3–1.2)
Total Protein: 7.5 g/dL (ref 6.5–8.1)

## 2021-02-16 LAB — BASIC METABOLIC PANEL
Anion gap: 9 (ref 5–15)
BUN: 16 mg/dL (ref 6–20)
CO2: 26 mmol/L (ref 22–32)
Calcium: 9.9 mg/dL (ref 8.9–10.3)
Chloride: 102 mmol/L (ref 98–111)
Creatinine, Ser: 0.85 mg/dL (ref 0.61–1.24)
GFR, Estimated: >60 mL/min (ref >60)
Glucose, Bld: 111 mg/dL — ABNORMAL HIGH (ref 70–99)
Potassium: 4.3 mmol/L (ref 3.5–5.1)
Sodium: 137 mmol/L (ref 135–145)

## 2021-02-16 LAB — LIPASE, BLOOD: Lipase: 30 U/L (ref 11–51)

## 2021-02-16 NOTE — ED Triage Notes (Signed)
Pt via POV from home. Pt c/o bilateral flank pain for 3 days and some dysuria that started yesterday. Some diarrhea. Denies NV. Denies fever. Denies hx of kidney stone. Pt is A&OX4 and NAD.

## 2021-02-16 NOTE — ED Provider Notes (Signed)
Emergency Medicine Provider Triage Evaluation Note  Brett Robinson , a 35 y.o. male  was evaluated in triage.  Pt complains of bilateral flank pain for the past 3 days and some dysuria that started yesterday.  Patient also reports some diarrhea.  Patient has mild bilateral lower extremity edema.  He has had some nausea but denies vomiting.  No chest pain, chest tightness or shortness of breath.  Denies history of nephrolithiasis.  Review of Systems  Positive: Patient has flank pain. Negative: No chest pain, chest tightness or abdominal pain.   Physical Exam  BP 137/89 (BP Location: Left Arm)   Pulse 86   Temp 98.4 F (36.9 C) (Oral)   Resp 20   Ht 6\' 1"  (1.854 m)   Wt 112 kg   SpO2 97%   BMI 32.59 kg/m  Gen:   Awake, no distress   Resp:  Normal effort  MSK:   Moves extremities without difficulty  Other:    Medical Decision Making  Medically screening exam initiated at 5:30 PM.  Appropriate orders placed.  Herson Prichard was informed that the remainder of the evaluation will be completed by another provider, this initial triage assessment does not replace that evaluation, and the importance of remaining in the ED until their evaluation is complete.     Lynnell Dike [place], PA-C 02/16/21 1733    13/08/22, MD 02/16/21 1921

## 2021-02-16 NOTE — ED Provider Notes (Signed)
[facility] Emergency Department Provider Note   ____________________________________________   Event Date/Time   First MD Initiated Contact with Patient 02/16/21 2005     (approximate)  I have reviewed the triage vital signs and the nursing notes.   HISTORY  Chief Complaint Flank Pain and Dysuria   HPI Brett Robinson is a 35 y.o. male who reports 3 days of urinating and making a lot of foam and some edema in his legs and bilateral CVA area pain.  He has not had any chest pain or tightness or shortness of breath.  Has not had any other problems.  He is worried about his kidneys.  He has not had any blood in the urine.  He is not urinating frequently he is just making a lot of foam.  He told the triage doctor that he had some dysuria but did not mention that to me.     Past medical history no pertinent past medical history    There are no problems to display for this patient.   Past Surgical History:  Procedure Laterality Date   skin graft     tubes in ears      Prior to Admission medications   Medication Sig Start Date End Date Taking? Authorizing Provider  cyclobenzaprine (FLEXERIL) 10 MG tablet Take 1 tablet (10 mg total) by mouth 3 (three) times daily as needed for muscle spasms. 10/30/20   Duffy Bruce, MD  ibuprofen (ADVIL) 600 MG tablet Take 1 tablet (600 mg total) by mouth every 8 (eight) hours as needed for moderate pain. 10/30/20   Duffy Bruce, MD  methylPREDNISolone (MEDROL DOSEPAK) 4 MG TBPK tablet Take as directed (21 tab pack). 10/30/20   Duffy Bruce, MD    Allergies Celebrex [celecoxib]  History reviewed. No pertinent family history.  Social History Social History   Tobacco Use   Smoking status: Never   Smokeless tobacco: Never  Vaping Use   Vaping Use: Some days  Substance Use Topics   Alcohol use: Yes   Drug use: Not Currently    Review of Systems  Constitutional: No fever/chills Eyes: No visual  changes. ENT: No sore throat. Cardiovascular: Denies chest pain. Respiratory: Denies shortness of breath. Gastrointestinal: No abdominal pain.  No nausea, no vomiting.  No diarrhea.  No constipation. Genitourinary:  dysuria. Musculoskeletal: Negative for back pain. Skin: Negative for rash. Neurological: Negative for headaches, focal weakness   ____________________________________________   PHYSICAL EXAM:  VITAL SIGNS: ED Triage Vitals  Enc Vitals Group     BP 02/16/21 1712 126/77     Pulse Rate 02/16/21 1712 74     Resp 02/16/21 1712 20     Temp 02/16/21 1712 98.6 F (37 C)     Temp Source 02/16/21 1712 Oral     SpO2 02/16/21 1712 94 %     Weight 02/16/21 1727 247 lb (112 kg)     Height 02/16/21 1727 6\' 1"  (1.854 m)     Head Circumference --      Peak Flow --      Pain Score 02/16/21 1727 7     Pain Loc --      Pain Edu? --      Excl. in [place]? --     Constitutional: Alert and oriented. Well appearing and in no acute distress. Eyes: Conjunctivae are normal.  Head: Atraumatic. Nose: No congestion/rhinnorhea. Mouth/Throat: Mucous membranes are moist.  Oropharynx non-erythematous. Neck: No stridor.   Cardiovascular: Normal rate, regular rhythm.  Grossly normal heart sounds.  Good peripheral circulation. Respiratory: Normal respiratory effort.  No retractions. Lungs CTAB. Gastrointestinal: Soft and nontender. No distention. No abdominal bruits.  Patient complains of lateral CVA tenderness. Musculoskeletal: No lower extremity tenderness trace bilateral edema.   Neurologic:  Normal speech and language. No gross focal neurologic deficits are appreciated. No gait instability. Skin:  Skin is warm, dry and intact. No rash noted.   ____________________________________________   LABS (all labs ordered are listed, but only abnormal results are displayed)  Labs Reviewed  BASIC METABOLIC PANEL - Abnormal; Notable for the following components:      Result Value   Glucose, Bld 111  (*)    All other components within normal limits  URINALYSIS, COMPLETE (UACMP) WITH MICROSCOPIC - Abnormal; Notable for the following components:   Color, Urine STRAW (*)    APPearance CLEAR (*)    Hgb urine dipstick SMALL (*)    All other components within normal limits  GASTROINTESTINAL PANEL BY PCR, STOOL (REPLACES STOOL CULTURE)  C DIFFICILE QUICK SCREEN W PCR REFLEX    CBC WITH DIFFERENTIAL/PLATELET  LIPASE, BLOOD  HEPATIC FUNCTION PANEL   ____________________________________________  EKG   ____________________________________________  RADIOLOGY Jill Poling, personally viewed and evaluated these images (plain radiographs) as part of my medical decision making, as well as reviewing the written report by the radiologist.  ED MD interpretation: CT read by radiology reviewed by me does not show any acute findings  Official radiology report(s): CT Renal Stone Study  Result Date: 02/16/2021 CLINICAL DATA:  Bilateral flank pain EXAM: CT ABDOMEN AND PELVIS WITHOUT CONTRAST TECHNIQUE: Multidetector CT imaging of the abdomen and pelvis was performed following the standard protocol without IV contrast. COMPARISON:  None. FINDINGS: Lower chest: No acute abnormality. Hepatobiliary: No focal liver abnormality is seen. No gallstones, gallbladder wall thickening, or biliary dilatation. Pancreas: Unremarkable. No pancreatic ductal dilatation or surrounding inflammatory changes. Spleen: Normal in size without focal abnormality. Adrenals/Urinary Tract: Adrenal glands are unremarkable. Kidneys show no hydronephrosis. Exophytic low-density lesion off the upper pole left kidney is probably a cyst. Bladder unremarkable Stomach/Bowel: Stomach is within normal limits. Appendix appears normal. No evidence of bowel wall thickening, distention, or inflammatory changes. Vascular/Lymphatic: No significant vascular findings are present. No enlarged abdominal or pelvic lymph nodes. Reproductive: Prostate is  unremarkable. Other: No abdominal wall hernia or abnormality. No abdominopelvic ascites. Musculoskeletal: No acute or significant osseous findings. IMPRESSION: No CT evidence for acute intra-abdominal or intrapelvic abnormality. Electronically Signed   By: Jasmine Pang M.D.   On: 02/16/2021 21:40    ____________________________________________   PROCEDURES  Procedure(s) performed (including Critical Care):  Procedures   ____________________________________________   INITIAL IMPRESSION / ASSESSMENT AND PLAN / ED COURSE  ----------------------------------------- 11:12 PM on 02/16/2021 ----------------------------------------- Patient's labs are essentially normal.  His renal function is normal.  He has no proteinuria.  He does have small blood on the dipstick and 0-5 RBCs per high-power field.  His urine is clear and straw-colored as it should be.  His CBC is also normal as is the CT scan does not show any sign of obstruction or other problem.  He has very slight edema in both his legs with no tenderness.  Patient reports he is up a lot during the day and this may be the cause of his edema.  I do not see anything that I need to treat tonight but I will have him follow-up with primary care and will provide a list of  different clinics he can go to and I have reassured him that he can come back here at any time if he is not any better and cannot follow-up with primary care or if anything worsens.          ____________________________________________   FINAL CLINICAL IMPRESSION(S) / ED DIAGNOSES  Final diagnoses:  Flank pain     ED Discharge Orders     None        Note:  This document was prepared using Dragon voice recognition software and may include unintentional dictation errors.    Nena Polio, MD 02/16/21 [phone]

## 2021-02-16 NOTE — Discharge Instructions (Addendum)
Please return for any worsening of your symptoms including flank pain or vomiting or fever or blood in the urine or more swelling or any other problems.  Please follow-up with primary care.  If you do not have insurance you can try the [facility] or the [facility] or the [facility] or [employer] care or [facility] charity care.  If you do have insurance you can try [facility] or [facility] or [facility] or [facility] or [facility].

## 2021-02-20 ENCOUNTER — Emergency Department: Payer: Self-pay

## 2021-02-20 ENCOUNTER — Encounter: Payer: Self-pay | Admitting: Emergency Medicine

## 2021-02-20 ENCOUNTER — Other Ambulatory Visit: Payer: Self-pay

## 2021-02-20 ENCOUNTER — Emergency Department
Admission: EM | Admit: 2021-02-20 | Discharge: 2021-02-20 | Disposition: A | Discharge status: Home / Self Care | Payer: Self-pay | Attending: Emergency Medicine | Admitting: Emergency Medicine

## 2021-02-20 DIAGNOSIS — E041 Nontoxic single thyroid nodule: Secondary | ICD-10-CM | POA: Insufficient documentation

## 2021-02-20 DIAGNOSIS — R0789 Other chest pain: Secondary | ICD-10-CM | POA: Insufficient documentation

## 2021-02-20 LAB — BASIC METABOLIC PANEL
Anion gap: 8 (ref 5–15)
BUN: 13 mg/dL (ref 6–20)
CO2: 28 mmol/L (ref 22–32)
Calcium: 9.8 mg/dL (ref 8.9–10.3)
Chloride: 103 mmol/L (ref 98–111)
Creatinine, Ser: 0.98 mg/dL (ref 0.61–1.24)
GFR, Estimated: >60 mL/min (ref >60)
Glucose, Bld: 120 mg/dL — ABNORMAL HIGH (ref 70–99)
Potassium: 4.2 mmol/L (ref 3.5–5.1)
Sodium: 139 mmol/L (ref 135–145)

## 2021-02-20 LAB — CBC
HCT: 43 % (ref 39.0–52.0)
Hemoglobin: 15 g/dL (ref 13.0–17.0)
MCH: 30.9 pg (ref 26.0–34.0)
MCHC: 34.9 g/dL (ref 30.0–36.0)
MCV: 88.7 fL (ref 80.0–100.0)
Platelets: 273 10*3/uL (ref 150–400)
RBC: 4.85 MIL/uL (ref 4.22–5.81)
RDW: 11.8 % (ref 11.5–15.5)
WBC: 5.6 10*3/uL (ref 4.0–10.5)
nRBC: 0 % (ref 0.0–0.2)

## 2021-02-20 LAB — TROPONIN I (HIGH SENSITIVITY): Troponin I (High Sensitivity): 3 ng/L (ref <18)

## 2021-02-20 MED ORDER — IOHEXOL 350 MG/ML SOLN
75.0000 mL | Freq: Once | INTRAVENOUS | Status: AC | PRN
  Administered 2021-02-20: 75 mL via INTRAVENOUS
  Filled 2021-02-20: qty 75

## 2021-02-20 NOTE — ED Provider Notes (Signed)
[facility] Emergency Department Provider Note   ____________________________________________    I have reviewed the triage vital signs and the nursing notes.   HISTORY  Chief Complaint Chest Pain, Back Pain, and Shortness of Breath     HPI Brett Robinson is a 35 y.o. male who presents with complaints of chest pain with radiation to the back.  Patient reports this is been ongoing for approximately 1 day seems to be worse today.  He denies fevers or chills or cough.  He reports the pain is between his shoulder blades in the back.  He is worried about a pulmonary embolism.  Denies a history of heart disease.  Does not smoke.  Has not take anything for this.  History reviewed. No pertinent past medical history.  There are no problems to display for this patient.   Past Surgical History:  Procedure Laterality Date   skin graft     tubes in ears      Prior to Admission medications   Medication Sig Start Date End Date Taking? Authorizing Provider  cyclobenzaprine (FLEXERIL) 10 MG tablet Take 1 tablet (10 mg total) by mouth 3 (three) times daily as needed for muscle spasms. 10/30/20   Shaune Pollack, MD  ibuprofen (ADVIL) 600 MG tablet Take 1 tablet (600 mg total) by mouth every 8 (eight) hours as needed for moderate pain. 10/30/20   Shaune Pollack, MD  methylPREDNISolone (MEDROL DOSEPAK) 4 MG TBPK tablet Take as directed (21 tab pack). 10/30/20   Shaune Pollack, MD     Allergies Celebrex [celecoxib]  No family history on file.  Social History Social History   Tobacco Use   Smoking status: Never   Smokeless tobacco: Never  Vaping Use   Vaping Use: Some days  Substance Use Topics   Alcohol use: Yes   Drug use: Not Currently    Review of Systems  Constitutional: No fever/chills Eyes: No visual changes.  ENT: No sore throat. Cardiovascular: As above Respiratory: Denies shortness of breath. Gastrointestinal: No abdominal pain.  No  nausea, no vomiting.   Genitourinary: Negative for dysuria. Musculoskeletal: Negative for back pain. Skin: Negative for rash. Neurological: Negative for headaches    ____________________________________________   PHYSICAL EXAM:  VITAL SIGNS: ED Triage Vitals  Enc Vitals Group     BP 02/20/21 0831 109/87     Pulse Rate 02/20/21 0831 97     Resp 02/20/21 0831 18     Temp 02/20/21 0831 98.2 F (36.8 C)     Temp Source 02/20/21 0831 Oral     SpO2 02/20/21 0831 100 %     Weight 02/20/21 0819 112 kg (247 lb)     Height 02/20/21 0819 1.854 m (6\' 1" )     Head Circumference --      Peak Flow --      Pain Score 02/20/21 0819 8     Pain Loc --      Pain Edu? --      Excl. in [place]? --     Constitutional: Alert and oriented. No acute distress. Pleasant and interactive Eyes: Conjunctivae are normal.  Head: Atraumatic. Nose: No congestion/rhinnorhea.  Cardiovascular: Normal rate, regular rhythm. Grossly normal heart sounds.  Good peripheral circulation. Respiratory: Normal respiratory effort.  No retractions. Lungs CTAB. Gastrointestinal: Soft and nontender. No distention.    Musculoskeletal: No lower extremity tenderness nor edema.  Warm and well perfused Neurologic:  Normal speech and language. No gross focal neurologic deficits are appreciated.  Skin:  Skin is warm, dry and intact. No rash noted. Psychiatric: Mood and affect are normal. Speech and behavior are normal.  ____________________________________________   LABS (all labs ordered are listed, but only abnormal results are displayed)  Labs Reviewed  BASIC METABOLIC PANEL - Abnormal; Notable for the following components:      Result Value   Glucose, Bld 120 (*)    All other components within normal limits  CBC  TROPONIN I (HIGH SENSITIVITY)   ____________________________________________  EKG  ED ECG REPORT I, Jene Every, the attending physician, personally viewed and interpreted this ECG.  Date:  02/20/2021  Rhythm: normal sinus rhythm QRS Axis: normal Intervals: normal ST/T Wave abnormalities: Nonspecific ST changes Narrative Interpretation: no evidence of acute ischemia  ____________________________________________  RADIOLOGY  Chest x-ray reviewed by me, no acute abnormality ____________________________________________   PROCEDURES  Procedure(s) performed: No  Procedures   Critical Care performed: No ____________________________________________   INITIAL IMPRESSION / ASSESSMENT AND PLAN / ED COURSE  Pertinent labs & imaging results that were available during my care of the patient were reviewed by me and considered in my medical decision making (see chart for details).   Patient presents with complaints of chest pain radiating through to his back.  EKG and high-sensitivity troponin are reassuring, doubt ACS, low risk for dissection, does report that he has had calf pain recently will send for CTA to rule out PE  Hemoglobin is normal, white blood cell count is normal.  CT angiography is normal, patient is feeling improved, will have him follow-up closely with cardiology for continued work-up.  Discussed thyroid nodule and the need for outpatient ultrasound with the patient, he agrees.    ____________________________________________   FINAL CLINICAL IMPRESSION(S) / ED DIAGNOSES  Final diagnoses:  Atypical chest pain  Thyroid nodule        Note:  This document was prepared using Dragon voice recognition software and may include unintentional dictation errors.    Jene Every, MD 02/20/21 1520

## 2021-02-20 NOTE — ED Triage Notes (Signed)
Pt reports cp to his mid chest sharp in nature, pain to his back between his shoulder blades and SOB since yesterday. Denies other sx's such as cough, nausea or diaphoresis.

## 2021-02-25 ENCOUNTER — Emergency Department: Payer: Self-pay

## 2021-02-25 ENCOUNTER — Emergency Department
Admission: EM | Admit: 2021-02-25 | Discharge: 2021-02-25 | Disposition: A | Discharge status: Home / Self Care | Payer: Self-pay | Attending: Emergency Medicine | Admitting: Emergency Medicine

## 2021-02-25 ENCOUNTER — Other Ambulatory Visit: Payer: Self-pay

## 2021-02-25 DIAGNOSIS — M940 Chondrocostal junction syndrome [Tietze]: Secondary | ICD-10-CM | POA: Insufficient documentation

## 2021-02-25 LAB — CBC
HCT: 41.9 % (ref 39.0–52.0)
Hemoglobin: 15 g/dL (ref 13.0–17.0)
MCH: 30.9 pg (ref 26.0–34.0)
MCHC: 35.8 g/dL (ref 30.0–36.0)
MCV: 86.4 fL (ref 80.0–100.0)
Platelets: 316 10*3/uL (ref 150–400)
RBC: 4.85 MIL/uL (ref 4.22–5.81)
RDW: 11.4 % — ABNORMAL LOW (ref 11.5–15.5)
WBC: 7.9 10*3/uL (ref 4.0–10.5)
nRBC: 0 % (ref 0.0–0.2)

## 2021-02-25 LAB — BASIC METABOLIC PANEL
Anion gap: 7 (ref 5–15)
BUN: 13 mg/dL (ref 6–20)
CO2: 27 mmol/L (ref 22–32)
Calcium: 10.7 mg/dL — ABNORMAL HIGH (ref 8.9–10.3)
Chloride: 104 mmol/L (ref 98–111)
Creatinine, Ser: 0.85 mg/dL (ref 0.61–1.24)
GFR, Estimated: >60 mL/min (ref >60)
Glucose, Bld: 112 mg/dL — ABNORMAL HIGH (ref 70–99)
Potassium: 4.1 mmol/L (ref 3.5–5.1)
Sodium: 138 mmol/L (ref 135–145)

## 2021-02-25 LAB — TROPONIN I (HIGH SENSITIVITY): Troponin I (High Sensitivity): 3 ng/L (ref <18)

## 2021-02-25 MED ORDER — MELOXICAM 15 MG PO TABS: 15.0000 mg | ORAL_TABLET | Freq: Every day | ORAL | 0 refills | Status: DC

## 2021-02-25 MED ORDER — HYDROXYZINE PAMOATE 100 MG PO CAPS: 100.0000 mg | ORAL_CAPSULE | Freq: Three times a day (TID) | ORAL | 0 refills | Status: DC | PRN

## 2021-02-25 MED ORDER — HYDROXYZINE HCL 25 MG PO TABS
25.0000 mg | ORAL_TABLET | Freq: Once | ORAL | Status: AC
  Administered 2021-02-25: 23:00:00 25 mg via ORAL
  Filled 2021-02-25: qty 1

## 2021-02-25 MED ORDER — MELOXICAM 7.5 MG PO TABS
15.0000 mg | ORAL_TABLET | Freq: Once | ORAL | Status: AC
  Administered 2021-02-25: 23:00:00 15 mg via ORAL
  Filled 2021-02-25: qty 2

## 2021-02-25 NOTE — ED Provider Notes (Signed)
[facility] Emergency Department Provider Note  ____________________________________________  Time seen: Approximately 11:51 PM  I have reviewed the triage vital signs and the nursing notes.   HISTORY  Chief Complaint Chest Pain    HPI Brett Robinson is a 35 y.o. male who presents emergency department for chest/rib pain.  Patient has been seen multiple times for similar complaints recently.  He states that he had COVID, felt like symptoms started after his COVID.  He has had some intermittent chest pain, has been evaluated multiple times with reassuring work-ups.  Patient was seen 6 days ago with chest pain, had CT scan at that time which had revealed an incidental finding of a nodule on his thyroid but otherwise was reassuring in regards to his chest pain.  Pain is palpable and reproducible with palpation along the chest wall.  No trauma to the chest wall.  Patient has not had any evaluation for his thyroid nodule at this time.  He does endorse some anxiety about his health which seems to be worsening in the past several months.       No past medical history on file.  There are no problems to display for this patient.   Past Surgical History:  Procedure Laterality Date   skin graft     tubes in ears      Prior to Admission medications   Medication Sig Start Date End Date Taking? Authorizing Provider  hydrOXYzine (VISTARIL) 100 MG capsule Take 1 capsule (100 mg total) by mouth 3 (three) times daily as needed for anxiety. 02/25/21  Yes Chaquita Basques, Delorise Royals, PA-C  cyclobenzaprine (FLEXERIL) 10 MG tablet Take 1 tablet (10 mg total) by mouth 3 (three) times daily as needed for muscle spasms. 10/30/20   Shaune Pollack, MD  ibuprofen (ADVIL) 600 MG tablet Take 1 tablet (600 mg total) by mouth every 8 (eight) hours as needed for moderate pain. 10/30/20   Shaune Pollack, MD  meloxicam (MOBIC) 15 MG tablet Take 1 tablet (15 mg total) by mouth daily. 02/25/21    Emslee Lopezmartinez, Delorise Royals, PA-C  methylPREDNISolone (MEDROL DOSEPAK) 4 MG TBPK tablet Take as directed (21 tab pack). 10/30/20   Shaune Pollack, MD    Allergies Celebrex [celecoxib]  No family history on file.  Social History Social History   Tobacco Use   Smoking status: Never   Smokeless tobacco: Never  Vaping Use   Vaping Use: Some days  Substance Use Topics   Alcohol use: Yes   Drug use: Not Currently     Review of Systems  Constitutional: No fever/chills Eyes: No visual changes. No discharge ENT: No upper respiratory complaints. Cardiovascular: Left-sided chest pain. Respiratory: no cough. No SOB. Gastrointestinal: No abdominal pain.  No nausea, no vomiting.  No diarrhea.  No constipation. Musculoskeletal: Negative for musculoskeletal pain. Skin: Negative for rash, abrasions, lacerations, ecchymosis. Neurological: Negative for headaches, focal weakness or numbness.  10 System ROS otherwise negative.  ____________________________________________   PHYSICAL EXAM:  VITAL SIGNS: ED Triage Vitals  Enc Vitals Group     BP 02/25/21 1630 121/79     Pulse Rate 02/25/21 1630 83     Resp 02/25/21 1630 18     Temp 02/25/21 1630 97.9 F (36.6 C)     Temp Source 02/25/21 1630 Oral     SpO2 02/25/21 1630 97 %     Weight 02/25/21 1629 245 lb (111.1 kg)     Height 02/25/21 1629 6' (1.829 m)     Head  Circumference --      Peak Flow --      Pain Score 02/25/21 1629 9     Pain Loc --      Pain Edu? --      Excl. in [place]? --      Constitutional: Alert and oriented. Well appearing and in no acute distress. Eyes: Conjunctivae are normal. PERRL. EOMI. Head: Atraumatic. ENT:      Ears:       Nose: No congestion/rhinnorhea.      Mouth/Throat: Mucous membranes are moist.  Neck: No stridor.  No cervical spine tenderness to palpation.  Cardiovascular: Normal rate, regular rhythm. Normal S1 and S2.  Good peripheral circulation. Respiratory: Normal respiratory effort without  tachypnea or retractions. Lungs CTAB. Good air entry to the bases with no decreased or absent breath sounds. Gastrointestinal: Bowel sounds 4 quadrants. Soft and nontender to palpation. No guarding or rigidity. No palpable masses. No distention. No CVA tenderness. Musculoskeletal: Full range of motion to all extremities. No gross deformities appreciated.  Palpation along the patient's posterior, lateral and anterior ribs reveals tenderness and intercostal distribution between ribs 6 and 7.  There is no palpable abnormality.  No crepitus.  No absent lung sounds. Neurologic:  Normal speech and language. No gross focal neurologic deficits are appreciated.  Skin:  Skin is warm, dry and intact. No rash noted. Psychiatric: Mood and affect are normal. Speech and behavior are normal. Patient exhibits appropriate insight and judgement.   ____________________________________________   LABS (all labs ordered are listed, but only abnormal results are displayed)  Labs Reviewed  BASIC METABOLIC PANEL - Abnormal; Notable for the following components:      Result Value   Glucose, Bld 112 (*)    Calcium 10.7 (*)    All other components within normal limits  CBC - Abnormal; Notable for the following components:   RDW 11.4 (*)    All other components within normal limits  TROPONIN I (HIGH SENSITIVITY)  TROPONIN I (HIGH SENSITIVITY)   ____________________________________________  EKG   ____________________________________________  RADIOLOGY I personally viewed and evaluated these images as part of my medical decision making, as well as reviewing the written report by the radiologist.  ED Provider Interpretation: No acute findings on chest x-ray  DG Chest 2 View  Result Date: 02/25/2021 CLINICAL DATA:  Chest pain EXAM: CHEST - 2 VIEW COMPARISON:  Radiograph dated February 20, 2021 FINDINGS: The heart size and mediastinal contours are within normal limits. Both lungs are clear. The visualized  skeletal structures are unremarkable. IMPRESSION: No active cardiopulmonary disease. Electronically Signed   By: Larose Hires D.O.   On: 02/25/2021 18:04    ____________________________________________    PROCEDURES  Procedure(s) performed:    Procedures    Medications  meloxicam (MOBIC) tablet 15 mg (15 mg Oral Given 02/25/21 2304)  hydrOXYzine (ATARAX/VISTARIL) tablet 25 mg (25 mg Oral Given 02/25/21 2304)     ____________________________________________   INITIAL IMPRESSION / ASSESSMENT AND PLAN / ED COURSE  Pertinent labs & imaging results that were available during my care of the patient were reviewed by me and considered in my medical decision making (see chart for details).  Review of the [place] CSRS was performed in accordance of the [facility] prior to dispensing any controlled drugs.           Patient's diagnosis is consistent with costochondritis.  Patient presented to the emergency department complaining of left-sided chest pain.  It appears that this pain was reproducible  with palpation and intercostal margins along the left chest wall.  Patient had labs, EKG, chest x-ray which were reassuring.  Patient had a PE chest performed less than a week ago with no evidence of pulmonary embolism.  At this time I do not feel we need to repeat this test.  I feel that there is a large anxiety component.  Whether this has anything to do with the patient's thyroid nodule versus generalized anxiety will be determined as patient continues his thyroid work-up.  Regardless at this time we will treat as costochondritis with anti-inflammatory, hydroxyzine for his anxiety.  Return precautions discussed with the patient.  Patient stable for discharge at this time.. Patient is given ED precautions to return to the ED for any worsening or new symptoms.     ____________________________________________  FINAL CLINICAL IMPRESSION(S) / ED DIAGNOSES  Final diagnoses:  Costochondritis, acute       NEW MEDICATIONS STARTED DURING THIS VISIT:  ED Discharge Orders          Ordered    meloxicam (MOBIC) 15 MG tablet  Daily,   Status:  Discontinued        02/25/21 2301    meloxicam (MOBIC) 15 MG tablet  Daily        02/25/21 2352    hydrOXYzine (VISTARIL) 100 MG capsule  3 times daily PRN        02/25/21 2352                This chart was dictated using voice recognition software/Dragon. Despite best efforts to proofread, errors can occur which can change the meaning. Any change was purely unintentional.    Racheal Patches, PA-C 02/26/21 0046    Gilles Chiquito, MD 02/26/21 [phone]

## 2021-02-25 NOTE — ED Triage Notes (Signed)
Pt here with CP that started a week ago,. Pt states pain is left sided and radiates down his arm. Pt also has a pinched nerve on the left side of his neck and a nodule on his thyroid. Pt in NAD in triage.

## 2021-02-25 NOTE — ED Notes (Addendum)
This tech called pt phone. Pt stated, "I am outside eating." This tech informed pt that he  will be taking to a room and that he needs to come back inside to be taking to a room. Pt continues to requesting for more time to stay outside to eat, pt stated, "I haven't eat all day. Could you wait a few minutes?." This tech informed pt that he can eat in his room. This tech went back to the waiting room to look for the pt. Pt is was not where to be found. Pt is not outside, or neither of the waiting rooms. Pt did not informed nursing staff he will be outside. Tresa Endo, RN is aware.

## 2021-03-15 ENCOUNTER — Ambulatory Visit: Payer: Self-pay | Admitting: *Deleted

## 2021-03-15 NOTE — Telephone Encounter (Signed)
Patient's sister called to get patient a new patient appt due to chest pain and shortness of breath and left side pain and numbness. Patient not with caller at this time. Called patient to review symptoms. Patient reports he has been having chest pain shortness of breath that comes and goes over time.dizziness lightheadedness comes and goes. Denies now. Did not report pain in left side and c/o he wants a primary dr . Set up patent with PCP first available at [facility] 06/16/21. No insurance at this time reports he will have insurance at 1st of year. Instructed patient to go to ED for chest pain and shortness of breath . If worsening sx call 911. Patient verbalized understanding of care advise. Please contact patient if earlier appt for new patient available .

## 2021-03-15 NOTE — Telephone Encounter (Signed)
Not a patient. Cannot advise.

## 2021-03-15 NOTE — Telephone Encounter (Signed)
Reason for Disposition  [1] Chest pain lasts < 5 minutes AND [2] NO chest pain or cardiac symptoms (e.g., breathing difficulty, sweating) now (Exception: chest pains that last only a few seconds)  Answer Assessment - Initial Assessment Questions 1. LOCATION: "Where does it hurt?"       Chest  2. RADIATION: "Does the pain go anywhere else?" (e.g., into neck, jaw, arms, back)     Na  3. ONSET: "When did the chest pain begin?" (Minutes, hours or days)      For a while  4. PATTERN "Does the pain come and go, or has it been constant since it start"  "Does it get worse with exertion?"      Comes and goes not now  5. DURATION: "How long does it last" (e.g., seconds, minutes, hours)     Na  6. SEVERITY: "How bad is the pain?"  (e.g., Scale 1-10; mild, moderate, or severe)    - MILD (1-3): doesn't interfere with normal activities     - MODERATE (4-7): interferes with normal activities or awakens from sleep    - SEVERE (8-10): excruciating pain, unable to do any normal activities       Na  7. CARDIAC RISK FACTORS: "Do you have any history of heart problems or risk factors for heart disease?" (e.g., angina, prior heart attack; diabetes, high blood pressure, high cholesterol, smoker, or strong family history of heart disease)     Na  8. PULMONARY RISK FACTORS: "Do you have any history of lung disease?"  (e.g., blood clots in lung, asthma, emphysema, birth control pills)     na 9. CAUSE: "What do you think is causing the chest pain?"     Not sure  10. OTHER SYMPTOMS: "Do you have any other symptoms?" (e.g., dizziness, nausea, vomiting, sweating, fever, difficulty breathing, cough)       Dizziness, lightheaded at times not now  11. PREGNANCY: "Is there any chance you are pregnant?" "When was your last menstrual period?"       na  Protocols used: Chest Pain-A-AH

## 2021-03-22 ENCOUNTER — Encounter (HOSPITAL_COMMUNITY): Payer: Self-pay | Admitting: Radiology

## 2021-03-29 ENCOUNTER — Other Ambulatory Visit: Payer: Self-pay

## 2021-03-29 ENCOUNTER — Emergency Department: Payer: Self-pay

## 2021-03-29 ENCOUNTER — Emergency Department
Admission: EM | Admit: 2021-03-29 | Discharge: 2021-03-29 | Disposition: A | Discharge status: Home / Self Care | Payer: Self-pay | Attending: Emergency Medicine | Admitting: Emergency Medicine

## 2021-03-29 DIAGNOSIS — R0789 Other chest pain: Secondary | ICD-10-CM

## 2021-03-29 DIAGNOSIS — I300 Acute nonspecific idiopathic pericarditis: Secondary | ICD-10-CM | POA: Insufficient documentation

## 2021-03-29 DIAGNOSIS — E041 Nontoxic single thyroid nodule: Secondary | ICD-10-CM | POA: Insufficient documentation

## 2021-03-29 LAB — CBC WITH DIFFERENTIAL/PLATELET
Abs Immature Granulocytes: 0.04 10*3/uL (ref 0.00–0.07)
Basophils Absolute: 0 10*3/uL (ref 0.0–0.1)
Basophils Relative: 0 %
Eosinophils Absolute: 0.1 10*3/uL (ref 0.0–0.5)
Eosinophils Relative: 1 %
HCT: 41.2 % (ref 39.0–52.0)
Hemoglobin: 15 g/dL (ref 13.0–17.0)
Immature Granulocytes: 1 %
Lymphocytes Relative: 20 %
Lymphs Abs: 1.6 10*3/uL (ref 0.7–4.0)
MCH: 30.8 pg (ref 26.0–34.0)
MCHC: 36.4 g/dL — ABNORMAL HIGH (ref 30.0–36.0)
MCV: 84.6 fL (ref 80.0–100.0)
Monocytes Absolute: 0.5 10*3/uL (ref 0.1–1.0)
Monocytes Relative: 7 %
Neutro Abs: 5.8 10*3/uL (ref 1.7–7.7)
Neutrophils Relative %: 71 %
Platelets: 291 10*3/uL (ref 150–400)
RBC: 4.87 MIL/uL (ref 4.22–5.81)
RDW: 11.9 % (ref 11.5–15.5)
WBC: 8 10*3/uL (ref 4.0–10.5)
nRBC: 0 % (ref 0.0–0.2)

## 2021-03-29 LAB — COMPREHENSIVE METABOLIC PANEL WITH GFR
ALT: 26 U/L (ref 0–44)
AST: 22 U/L (ref 15–41)
Albumin: 4.9 g/dL (ref 3.5–5.0)
Alkaline Phosphatase: 65 U/L (ref 38–126)
Anion gap: 9 (ref 5–15)
BUN: 13 mg/dL (ref 6–20)
CO2: 22 mmol/L (ref 22–32)
Calcium: 10 mg/dL (ref 8.9–10.3)
Chloride: 107 mmol/L (ref 98–111)
Creatinine, Ser: 0.82 mg/dL (ref 0.61–1.24)
GFR, Estimated: >60 mL/min
Glucose, Bld: 96 mg/dL (ref 70–99)
Potassium: 4.2 mmol/L (ref 3.5–5.1)
Sodium: 138 mmol/L (ref 135–145)
Total Bilirubin: 0.8 mg/dL (ref 0.3–1.2)
Total Protein: 7.5 g/dL (ref 6.5–8.1)

## 2021-03-29 LAB — TROPONIN I (HIGH SENSITIVITY)
Troponin I (High Sensitivity): 2 ng/L (ref <18)
Troponin I (High Sensitivity): 3 ng/L

## 2021-03-29 MED ORDER — KETOROLAC TROMETHAMINE 30 MG/ML IJ SOLN
15.0000 mg | Freq: Once | INTRAMUSCULAR | Status: AC
  Administered 2021-03-29: 14:00:00 15 mg via INTRAVENOUS
  Filled 2021-03-29: qty 1

## 2021-03-29 MED ORDER — COLCHICINE 0.6 MG PO TABS: 0.6000 mg | ORAL_TABLET | Freq: Two times a day (BID) | ORAL | 0 refills | Status: DC

## 2021-03-29 MED ORDER — IOHEXOL 350 MG/ML SOLN
75.0000 mL | Freq: Once | INTRAVENOUS | Status: AC | PRN
  Administered 2021-03-29: 15:00:00 75 mL via INTRAVENOUS

## 2021-03-29 NOTE — ED Provider Notes (Signed)
[facility] Emergency Department Provider Note   ____________________________________________   Event Date/Time   First MD Initiated Contact with Patient 03/29/21 1407     (approximate)  I have reviewed the triage vital signs and the nursing notes.   HISTORY  Chief Complaint Chest Pain    HPI Brett Robinson is a 35 y.o. male with no significant past medical history presents to the ED complaining of chest pain.  Patient reports that he has been dealing with intermittent sharp pain in the center of his chest for the past couple of weeks.  He had been seen in the ED on a couple of occasions with unremarkable work-up, but states he woke up around 10:00 this morning with similar pain.  He describes it as severe and radiating to the area near his shoulder blade on the left.  Pain is exacerbated by a deep breath and he complains of some mild difficulty breathing.  Initial EKG with EMS showed ST elevation inferiorly and code STEMI was activated prior to arrival.  Patient was given nitroglycerin and now states that chest pain is much improved.  He denies any fevers, cough, pain or swelling in his legs.        History reviewed. No pertinent past medical history.  There are no problems to display for this patient.   Past Surgical History:  Procedure Laterality Date   skin graft     tubes in ears      Prior to Admission medications   Medication Sig Start Date End Date Taking? Authorizing Provider  ASHWAGANDHA PO Take 1 tablet by mouth daily.   Yes [provider]  colchicine 0.6 MG tablet Take 1 tablet (0.6 mg total) by mouth 2 (two) times daily for 14 days. 03/29/21 04/12/21 Yes Blake Divine, MD  Misc Natural Products (GLUCOSAMINE CHOND CMP ADVANCED PO) Take 1 tablet by mouth daily.   Yes [provider]  Multiple Vitamin (MULTIVITAMIN) tablet Take 1 tablet by mouth daily.   Yes [provider]  albuterol (VENTOLIN HFA) 108 (90  Base) MCG/ACT inhaler Inhale 2 puffs into the lungs every 4 (four) hours as needed. Patient not taking: Reported on 03/29/2021 03/01/21   [provider]  cyclobenzaprine (FLEXERIL) 10 MG tablet Take 1 tablet (10 mg total) by mouth 3 (three) times daily as needed for muscle spasms. Patient not taking: Reported on 03/29/2021 10/30/20   Duffy Bruce, MD  cyclobenzaprine (FLEXERIL) 5 MG tablet Take 5 mg by mouth 3 (three) times daily as needed. Patient not taking: Reported on 03/29/2021 12/15/20   [provider]  gabapentin (NEURONTIN) 300 MG capsule Take 300 mg by mouth 3 (three) times daily. Patient not taking: Reported on 03/29/2021 01/26/21   [provider]  hydrOXYzine (VISTARIL) 100 MG capsule Take 1 capsule (100 mg total) by mouth 3 (three) times daily as needed for anxiety. Patient not taking: Reported on 03/29/2021 02/25/21   Cuthriell, Charline Bills, PA-C  ibuprofen (ADVIL) 600 MG tablet Take 1 tablet (600 mg total) by mouth every 8 (eight) hours as needed for moderate pain. Patient not taking: Reported on 03/29/2021 10/30/20   Duffy Bruce, MD  meloxicam (MOBIC) 15 MG tablet Take 1 tablet (15 mg total) by mouth daily. Patient not taking: Reported on 03/29/2021 02/25/21   Cuthriell, Charline Bills, PA-C  methylPREDNISolone (MEDROL DOSEPAK) 4 MG TBPK tablet Take as directed (21 tab pack). Patient not taking: Reported on 03/29/2021 10/30/20   Duffy Bruce, MD  Allergies Celebrex [celecoxib] and Nsaids  History reviewed. No pertinent family history.  Social History Social History   Tobacco Use   Smoking status: Never   Smokeless tobacco: Never  Vaping Use   Vaping Use: Some days  Substance Use Topics   Alcohol use: Yes   Drug use: Not Currently    Review of Systems  Constitutional: No fever/chills Eyes: No visual changes. ENT: No sore throat. Cardiovascular: Positive for chest pain. Respiratory: Negative for cough, positive for shortness of  breath. Gastrointestinal: No abdominal pain.  No nausea, no vomiting.  No diarrhea.  No constipation. Genitourinary: Negative for dysuria. Musculoskeletal: Negative for back pain. Skin: Negative for rash. Neurological: Negative for headaches, focal weakness or numbness.  ____________________________________________   PHYSICAL EXAM:  VITAL SIGNS: ED Triage Vitals  Enc Vitals Group     BP --      Pulse Rate 03/29/21 1408 63     Resp 03/29/21 1408 12     Temp --      Temp src --      SpO2 03/29/21 1408 100 %     Weight 03/29/21 1405 244 lb (110.7 kg)     Height 03/29/21 1405 6' (1.829 m)     Head Circumference --      Peak Flow --      Pain Score 03/29/21 1405 10     Pain Loc --      Pain Edu? --      Excl. in GC? --     Constitutional: Alert and oriented. Eyes: Conjunctivae are normal. Head: Atraumatic. Nose: No congestion/rhinnorhea. Mouth/Throat: Mucous membranes are moist. Neck: Normal ROM Cardiovascular: Normal rate, regular rhythm. Grossly normal heart sounds.  2+ radial pulses bilaterally. Respiratory: Normal respiratory effort.  No retractions. Lungs CTAB.  No chest wall tenderness to palpation. Gastrointestinal: Soft and nontender. No distention. Genitourinary: deferred Musculoskeletal: No lower extremity tenderness nor edema. Neurologic:  Normal speech and language. No gross focal neurologic deficits are appreciated. Skin:  Skin is warm, dry and intact. No rash noted. Psychiatric: Mood and affect are normal. Speech and behavior are normal.  ____________________________________________   LABS (all labs ordered are listed, but only abnormal results are displayed)  Labs Reviewed  CBC WITH DIFFERENTIAL/PLATELET - Abnormal; Notable for the following components:      Result Value   MCHC 36.4 (*)    All other components within normal limits  COMPREHENSIVE METABOLIC PANEL  TROPONIN I (HIGH SENSITIVITY)  TROPONIN I (HIGH SENSITIVITY)    ____________________________________________  EKG  ED ECG REPORT I, Chesley Noon, the attending physician, personally viewed and interpreted this ECG.   Date: 03/29/2021  EKG Time: 14:10  Rate: 72  Rhythm: normal sinus rhythm  Axis: Normal  Intervals:none  ST&T Change: Minimal anterior and inferior ST elevation, no reciprocal changes noted   PROCEDURES  Procedure(s) performed (including Critical Care):  Procedures   ____________________________________________   INITIAL IMPRESSION / ASSESSMENT AND PLAN / ED COURSE      35 year old male with no significant past medical history presents to the ED complaining of sharp pain in the left side of his chest intermittently for the past 2 weeks that radiates towards his left scapula and is exacerbated by deep breath.  Symptoms clinically sound consistent with pericarditis versus PE, low suspicion for ACS and code STEMI was canceled by cardiology.  EKG here in the ED shows minimal inferior and anterior ST elevation with subtle PR depression that could represent pericarditis.  We will check  chest x-ray, troponin, and CTA of chest.  We will treat symptomatically with IV Toradol and reassess.  Labs are unremarkable, troponin within normal limits.  Chest x-ray reviewed by me and shows no infiltrate, edema, or effusion.  CTA chest is negative for PE or other acute process.  Findings reviewed with Dr. Fletcher Anon of cardiology, who agrees that symptoms are likely due to pericarditis.  No evidence of pericardial effusion noted on CT scan and patient is appropriate for outpatient management.  Per cardiology, we will start patient on colchicine twice daily and he may take NSAIDs as needed for breakthrough pain.  If repeat troponin is within normal limits, patient be appropriate for outpatient management with cardiology.  Patient turned over to oncoming provider pending repeat troponin.      ____________________________________________   FINAL  CLINICAL IMPRESSION(S) / ED DIAGNOSES  Final diagnoses:  Atypical chest pain  Acute idiopathic pericarditis  Thyroid nodule     ED Discharge Orders          Ordered    colchicine 0.6 MG tablet  2 times daily        03/29/21 1615             Note:  This document was prepared using Dragon voice recognition software and may include unintentional dictation errors.    Blake Divine, MD 03/29/21 [phone]

## 2021-03-29 NOTE — Discharge Instructions (Addendum)
Your symptoms today are consistent with pericarditis, you may take colchicine twice daily for 2 weeks to help with your pain.  On top of this, you may take ibuprofen or naproxen as needed for breakthrough pain.  Please schedule follow-up with cardiology and return to the ER for worsening symptoms.  Please schedule follow-up thyroid ultrasound with your primary care doctor.

## 2021-03-29 NOTE — ED Provider Notes (Signed)
Patient seen and cleared by Dr. Larinda Buttery and Cardiology. Briefly, 35 yo M here with concern for pericarditis. Will be discharged with outpt treatment if second trop is negative. Pt updated and in agreement.  Second trop negative. VSS. Meds rx'ed by Dr. Larinda Buttery.   Shaune Pollack, MD 03/29/21 [id number]

## 2021-03-29 NOTE — ED Triage Notes (Signed)
Pt to ED via [facility]. Pt states upon waking at 10am started having severe CP that radiates to his back. EMS EKG showing ST elevation and STEMI was called in field. STEMI cancelled PTA. Pt stating CP has been on and off for several weeks.Pt was on the way to [facility] when he started feeling more light headed and the CP was worse. VSS in route. Pt A&Ox4 on arrival.

## 2021-03-30 ENCOUNTER — Telehealth: Payer: Self-pay | Admitting: Cardiology

## 2021-03-30 NOTE — Telephone Encounter (Signed)
LMOV  

## 2021-03-30 NOTE — Telephone Encounter (Signed)
-----   Message from Iran Ouch, MD sent at 03/29/2021  3:48 PM EST ----- Patient referred from the ED for evaluation of chest pain and possible pericarditis.  Schedule with first available MD within 2 weeks.

## 2021-04-08 ENCOUNTER — Telehealth: Payer: Self-pay

## 2021-04-08 ENCOUNTER — Ambulatory Visit (INDEPENDENT_AMBULATORY_CARE_PROVIDER_SITE_OTHER): Payer: Self-pay | Admitting: Internal Medicine

## 2021-04-08 ENCOUNTER — Encounter: Payer: Self-pay | Admitting: Internal Medicine

## 2021-04-08 ENCOUNTER — Other Ambulatory Visit: Payer: Self-pay

## 2021-04-08 VITALS — BP 111/72 | HR 47 | Temp 97.8°F | Ht 70.47 in | Wt 236.8 lb

## 2021-04-08 DIAGNOSIS — R071 Chest pain on breathing: Secondary | ICD-10-CM

## 2021-04-08 DIAGNOSIS — I319 Disease of pericardium, unspecified: Secondary | ICD-10-CM

## 2021-04-08 MED ORDER — ONDANSETRON HCL 8 MG PO TABS: 8.0000 mg | ORAL_TABLET | Freq: Three times a day (TID) | ORAL | 0 refills | Status: DC | PRN

## 2021-04-08 NOTE — Telephone Encounter (Signed)
After reviewing chart, saw that Dr. Kirke Corin recommended that patient be seen by first available MD within 2 weeks of 03/29/21. Dr. Azucena Cecil had new patient appt available 04/09/21, called [facility] back and offered appt. Appt accepted. Pt to see Dr. Azucena Cecil 04/09/21 at 8:00.

## 2021-04-08 NOTE — Progress Notes (Signed)
BP 111/72    Pulse (!) 47    Temp 97.8 F (36.6 C) (Oral)    Ht 5' 10.47" (1.79 m)    Wt 236 lb 12.8 oz (107.4 kg)    SpO2 98%    BMI 33.52 kg/m    Subjective:    Patient ID: Brett Robinson, male    DOB: February 19, 1986, 35 y.o.   MRN: [id number]  Chief Complaint  Patient presents with   New Patient (Initial Visit)   Thyroid Nodule   Bloated    Has been taking Gas-X daily with no relief    Shortness of Breath   Dizziness    HPI: Brett Robinson is a 35 y.o. male  Pt is here to establish care.  Pt was in hte ER , was driving him to [facility] as he was feeling sick woke up at 10 am and was nauseous , had a headache had a pulse ox at home and HR was 200 bpm.  In the EMS - HR was high too per pt.  Pt was recently diagnosed with pericarditis to fu with cards @ [facility]  Taking celebrex helping some. Was rx colchicine stopped taking this.  Was feeling like this x 6 months, for the past month -  Smoking pot - no iv Drug abuse.  Had some viral infection  last June and was not hospitalized  see a physician Didn't have a pcp   Has pain on the left side of the chest sternum hurts per pt. Shoulder pain on the left.     Shortness of Breath This is a new problem. The current episode started 1 to 4 weeks ago. Pertinent negatives include no abdominal pain, chest pain, fever, headaches, rash, sore throat or swollen glands.  Dizziness This is a new (off and on) problem. The current episode started 1 to 4 weeks ago. Associated symptoms include fatigue. Pertinent negatives include no abdominal pain, anorexia, arthralgias, change in bowel habit, chest pain, chills, congestion, fever, headaches, joint swelling, myalgias, rash, sore throat, swollen glands, urinary symptoms or vertigo.   Chief Complaint  Patient presents with   New Patient (Initial Visit)   Thyroid Nodule   Bloated    Has been taking Gas-X daily with no relief    Shortness of Breath   Dizziness    Relevant past  medical, surgical, family and social history reviewed and updated as indicated. Interim medical history since our last visit reviewed. Allergies and medications reviewed and updated.  Review of Systems  Constitutional:  Positive for fatigue. Negative for chills and fever.  HENT:  Negative for congestion and sore throat.   Respiratory:  Positive for shortness of breath.   Cardiovascular:  Negative for chest pain.  Gastrointestinal:  Negative for abdominal pain, anorexia and change in bowel habit.  Musculoskeletal:  Negative for arthralgias, joint swelling and myalgias.  Skin:  Negative for rash.  Neurological:  Positive for dizziness. Negative for vertigo and headaches.   Per HPI unless specifically indicated above     Objective:    BP 111/72    Pulse (!) 47    Temp 97.8 F (36.6 C) (Oral)    Ht 5' 10.47" (1.79 m)    Wt 236 lb 12.8 oz (107.4 kg)    SpO2 98%    BMI 33.52 kg/m   Wt Readings from Last 3 Encounters:  04/08/21 236 lb 12.8 oz (107.4 kg)  03/29/21 244 lb (110.7 kg)  02/25/21 245 lb (111.1 kg)  Physical Exam Vitals and nursing note reviewed.  Constitutional:      General: He is not in acute distress.    Appearance: Normal appearance. He is not ill-appearing or diaphoretic.  HENT:     Head: Normocephalic and atraumatic.     Right Ear: Tympanic membrane and external ear normal. There is no impacted cerumen.     Left Ear: External ear normal.     Nose: No congestion or rhinorrhea.     Mouth/Throat:     Pharynx: No oropharyngeal exudate or posterior oropharyngeal erythema.  Eyes:     Conjunctiva/sclera: Conjunctivae normal.     Pupils: Pupils are equal, round, and reactive to light.  Cardiovascular:     Rate and Rhythm: Normal rate and regular rhythm.     Heart sounds: No murmur heard.   No friction rub. No gallop.  Pulmonary:     Effort: No respiratory distress.     Breath sounds: No stridor. No wheezing or rhonchi.  Chest:     Chest wall: No tenderness.   Abdominal:     General: Abdomen is flat. Bowel sounds are normal.     Palpations: Abdomen is soft. There is no mass.     Tenderness: There is no abdominal tenderness.  Musculoskeletal:        General: Normal range of motion.     Cervical back: Normal range of motion and neck supple. No rigidity or tenderness.     Left lower leg: No edema.  Skin:    General: Skin is warm and dry.  Neurological:     General: No focal deficit present.     Mental Status: He is alert.    Results for orders placed or performed during the hospital encounter of 03/29/21  CBC with Differential  Result Value Ref Range   WBC 8.0 4.0 - 10.5 K/uL   RBC 4.87 4.22 - 5.81 MIL/uL   Hemoglobin 15.0 13.0 - 17.0 g/dL   HCT 16.141.2 09.639.0 - 04.552.0 %   MCV 84.6 80.0 - 100.0 fL   MCH 30.8 26.0 - 34.0 pg   MCHC 36.4 (H) 30.0 - 36.0 g/dL   RDW 40.911.9 81.111.5 - 91.415.5 %   Platelets 291 150 - 400 K/uL   nRBC 0.0 0.0 - 0.2 %   Neutrophils Relative % 71 %   Neutro Abs 5.8 1.7 - 7.7 K/uL   Lymphocytes Relative 20 %   Lymphs Abs 1.6 0.7 - 4.0 K/uL   Monocytes Relative 7 %   Monocytes Absolute 0.5 0.1 - 1.0 K/uL   Eosinophils Relative 1 %   Eosinophils Absolute 0.1 0.0 - 0.5 K/uL   Basophils Relative 0 %   Basophils Absolute 0.0 0.0 - 0.1 K/uL   Immature Granulocytes 1 %   Abs Immature Granulocytes 0.04 0.00 - 0.07 K/uL  Comprehensive metabolic panel  Result Value Ref Range   Sodium 138 135 - 145 mmol/L   Potassium 4.2 3.5 - 5.1 mmol/L   Chloride 107 98 - 111 mmol/L   CO2 22 22 - 32 mmol/L   Glucose, Bld 96 70 - 99 mg/dL   BUN 13 6 - 20 mg/dL   Creatinine, Ser 7.820.82 0.61 - 1.24 mg/dL   Calcium 95.610.0 8.9 - 21.310.3 mg/dL   Total Protein 7.5 6.5 - 8.1 g/dL   Albumin 4.9 3.5 - 5.0 g/dL   AST 22 15 - 41 U/L   ALT 26 0 - 44 U/L   Alkaline Phosphatase 65 38 - 126  U/L   Total Bilirubin 0.8 0.3 - 1.2 mg/dL   GFR, Estimated >60 >60 mL/min   Anion gap 9 5 - 15  Troponin I (High Sensitivity)  Result Value Ref Range   Troponin I (High  Sensitivity) 2 <18 ng/L  Troponin I (High Sensitivity)  Result Value Ref Range   Troponin I (High Sensitivity) 3 <18 ng/L        Current Outpatient Medications:    ibuprofen (ADVIL) 600 MG tablet, Take 1 tablet (600 mg total) by mouth every 8 (eight) hours as needed for moderate pain., Disp: 20 tablet, Rfl: 0   ASHWAGANDHA PO, Take 1 tablet by mouth daily. (Patient not taking: Reported on 04/08/2021), Disp: , Rfl:    cyclobenzaprine (FLEXERIL) 10 MG tablet, Take 1 tablet (10 mg total) by mouth 3 (three) times daily as needed for muscle spasms. (Patient not taking: Reported on 03/29/2021), Disp: 30 tablet, Rfl: 0   Misc Natural Products (GLUCOSAMINE CHOND CMP ADVANCED PO), Take 1 tablet by mouth daily. (Patient not taking: Reported on 04/08/2021), Disp: , Rfl:    Multiple Vitamin (MULTIVITAMIN) tablet, Take 1 tablet by mouth daily. (Patient not taking: Reported on 04/08/2021), Disp: , Rfl:     CT Angio chest : 12/19   1. No pulmonary embolus. 2. No acute intrapulmonary abnormality. 3. Persistent 1.5 cm hypodense right thyroid gland nodule. Recommend thyroid US (ref: J Am Coll Radiol. 2015 Feb;12(2): 143-50). 4. Other imaging findings of potential clinical significance: Colonic diverticulosis with no acute diverticulitis.  Assessment & Plan:  Pericarditis: Is on colchicine and celebrex  Take colchicine 0.5 mg x 14 days continue Celebrex as rx by ER Tried to call Cardiology @ [facility], no appts until January 4th. Will need to fu with cards @ [facility] we were able to fit him in tomorrow @ 8 am. Needs ECHO. To take zofran before colchicine.   Problem List Items Addressed This Visit   None    Orders Placed This Encounter  Procedures   TSH   CBC with Differential/Platelet   Comprehensive metabolic panel   Lipid panel   Urinalysis, Routine w reflex microscopic   T4, free   HIV antibody (with reflex)   Acute Viral Hepatitis (HAV, HBV, HCV)   Ambulatory referral to  Cardiology   EKG 12-Lead     Meds ordered this encounter  Medications   ondansetron (ZOFRAN) 8 MG tablet    Sig: Take 1 tablet (8 mg total) by mouth every 8 (eight) hours as needed for nausea or vomiting.    Dispense:  20 tablet    Refill:  0     Follow up plan: No follow-ups on file.

## 2021-04-08 NOTE — Telephone Encounter (Signed)
Called and spoke with [facility]. Explained that Dr. Okey Dupre is not in the office tomorrow or next week and that his schedule is currently full past the patient's current appt date. [facility] verbalized understanding and voiced appreciation for the call back.

## 2021-04-08 NOTE — Telephone Encounter (Signed)
[facility] calling Patient is scheduled to see Dr End on 04/29/20 but they are calling because they want him in sooner - asking for tomorrow or Tuesday  Please advise

## 2021-04-09 ENCOUNTER — Ambulatory Visit (INDEPENDENT_AMBULATORY_CARE_PROVIDER_SITE_OTHER): Payer: Self-pay

## 2021-04-09 ENCOUNTER — Other Ambulatory Visit: Payer: Self-pay | Admitting: Cardiology

## 2021-04-09 ENCOUNTER — Ambulatory Visit (INDEPENDENT_AMBULATORY_CARE_PROVIDER_SITE_OTHER): Payer: Self-pay | Admitting: Cardiology

## 2021-04-09 ENCOUNTER — Encounter: Payer: Self-pay | Admitting: Cardiology

## 2021-04-09 VITALS — BP 110/70 | HR 67 | Ht 70.5 in | Wt 235.0 lb

## 2021-04-09 DIAGNOSIS — R079 Chest pain, unspecified: Secondary | ICD-10-CM

## 2021-04-09 DIAGNOSIS — R002 Palpitations: Secondary | ICD-10-CM

## 2021-04-09 LAB — HIV ANTIBODY (ROUTINE TESTING W REFLEX): HIV Screen 4th Generation wRfx: NONREACTIVE

## 2021-04-09 LAB — ACUTE VIRAL HEPATITIS (HAV, HBV, HCV)
HCV Ab: <0.1 s/co ratio (ref 0.0–0.9)
Hep A IgM: NEGATIVE
Hep B C IgM: NEGATIVE
Hepatitis B Surface Ag: NEGATIVE

## 2021-04-09 LAB — HCV INTERPRETATION

## 2021-04-09 MED ORDER — IBUPROFEN 200 MG PO TABS: ORAL_TABLET | ORAL | 0 refills | Status: DC

## 2021-04-09 MED ORDER — PANTOPRAZOLE SODIUM 40 MG PO TBEC: 40.0000 mg | DELAYED_RELEASE_TABLET | Freq: Every day | ORAL | 11 refills | Status: DC

## 2021-04-09 NOTE — Progress Notes (Signed)
Cardiology Office Note:    Date:  04/09/2021   ID:  Brett Robinson, DOB [birth year], MRN [id number]  PCP:  Brett Cousins, MD   [place] [facility] Cardiologist:  Brett Sable, MD     Referring MD: Brett Bruce, MD   Chief Complaint  Patient presents with   NEW patient-F/U after ED visit for chest pain    Patient also reports fluctuating heart rates and feeling like his heart is racing at times.    History of Present Illness:    Brett Robinson is a 35 y.o. male with a hx of GERD, former smoker x10 years who presents due to chest pain and palpitations.  Patient states having symptoms of chest discomfort over the past month.  Symptoms are sometimes associated with taking deep breaths or laying flat on his back.  Sitting up relieves chest discomfort.  Presented to the ED about a week ago where work-up including troponins was unrevealing.  EKG showed diffuse ST, started on colchicine but patient did not tolerate due to nausea and, diarrhea.  He therefore stopped colchicine.  Was taking Celebrex due to musculoskeletal pain, chest discomfort associated with palpation.  States when he raises his arm, chest discomfort also gets worse.  Has palpitations with heart rates up to 200s when he checks on his pulse oximetry or Coreg at least 2 times weekly.  No syncopal episodes noted.  Past Medical History:  Diagnosis Date   Anxiety    For a while because of medical issues that no one can figure out   GERD (gastroesophageal reflux disease)    Neuromuscular disorder ([place])    Not sure on the date   Sleep apnea     Past Surgical History:  Procedure Laterality Date   skin graft     tubes in ears      Current Medications: Current Meds  Medication Sig   acetaminophen (TYLENOL) 500 MG tablet Take 500-1,000 mg by mouth daily.   cyclobenzaprine (FLEXERIL) 10 MG tablet Take 1 tablet (10 mg total) by mouth 3 (three) times daily as needed for muscle spasms.   ibuprofen (ADVIL) 200  MG tablet Take 800 mg (4 pills) 3 times a day for 14 days, then take 800 mg (4 pills) twice daily for 7 days, then take 800 mg (4 pills) daily for 7 days   OVER THE COUNTER MEDICATION Take 2 Pieces by mouth daily. Elderberry Vitamin C zinc gummies   pantoprazole (PROTONIX) 40 MG tablet Take 1 tablet (40 mg total) by mouth daily.   [DISCONTINUED] celecoxib (CELEBREX) 100 MG capsule Take 100 mg by mouth 2 (two) times daily.     Allergies:   Celebrex [celecoxib] and Nsaids   Social History   Socioeconomic History   Marital status: Single    Spouse name: Not on file   Number of children: Not on file   Years of education: Not on file   Highest education level: Not on file  Occupational History   Not on file  Tobacco Use   Smoking status: Former    Packs/day: 1.00    Years: 5.00    Pack years: 5.00    Types: Cigarettes    Quit date: 05/15/2014    Years since quitting: 6.9   Smokeless tobacco: Current    Types: Chew  Vaping Use   Vaping Use: Former  Substance and Sexual Activity   Alcohol use: Not Currently   Drug use: Not Currently   Sexual activity: Yes    Birth  control/protection: Condom  Other Topics Concern   Not on file  Social History Narrative   Not on file   Social Determinants of Health   Financial Resource Strain: Not on file  Food Insecurity: Not on file  Transportation Needs: Not on file  Physical Activity: Not on file  Stress: Not on file  Social Connections: Not on file     Family History: The patient's family history includes Colitis in his sister; Gallbladder disease in his mother and sister; Heart disease in his father; Hiatal hernia in his sister; Hypertension in his brother; Stroke in his father; Thyroid disease in his mother.  ROS:   Please see the history of present illness.     All other systems reviewed and are negative.  EKGs/Labs/Other Studies Reviewed:    The following studies were reviewed today:   EKG:  EKG is  ordered today.  The ekg  ordered today demonstrates normal sinus rhythm, normal ECG.  Recent Labs: 03/29/2021: ALT 26; BUN 13; Creatinine, Ser 0.82; Hemoglobin 15.0; Platelets 291; Potassium 4.2; Sodium 138  Recent Lipid Panel No results found for: CHOL, TRIG, HDL, CHOLHDL, VLDL, LDLCALC, LDLDIRECT   Risk Assessment/Calculations:          Physical Exam:    VS:  BP 110/70 (BP Location: Left Arm, Patient Position: Sitting, Cuff Size: Large)    Pulse 67    Ht 5' 10.5" (1.791 m)    Wt 235 lb (106.6 kg)    SpO2 96%    BMI 33.24 kg/m     Wt Readings from Last 3 Encounters:  04/09/21 235 lb (106.6 kg)  04/08/21 236 lb 12.8 oz (107.4 kg)  03/29/21 244 lb (110.7 kg)     GEN:  Well nourished, well developed in no acute distress HEENT: Normal NECK: No JVD; No carotid bruits LYMPHATICS: No lymphadenopathy CARDIAC: RRR, no murmurs, rubs, gallops RESPIRATORY:  Clear to auscultation without rales, wheezing or rhonchi  ABDOMEN: Soft, non-tender, non-distended MUSCULOSKELETAL:  No edema; chest tenderness noted with palpation. SKIN: Warm and dry NEUROLOGIC:  Alert and oriented x 3 PSYCHIATRIC:  Normal affect   ASSESSMENT:    1. Chest pain, unspecified type   2. Palpitations    PLAN:    In order of problems listed above:  Chest pain, appears musculoskeletal with tenderness to palpation, although patient has a pleuritic component suggesting inflammation or pericarditis.  Start ibuprofen 800 mg 3 times daily x2 weeks.  Taper after 2 weeks to 800 mg daily until follow-up.  Start Protonix 40 mg daily.  Obtain ESR CRP.  Get echocardiogram.  If echo is unrevealing, and pleuritic symptoms resolved, plan to refer to primary regarding musculoskeletal pain.  Symptoms not consistent with angina. Palpitations, place cardiac monitor x2 weeks to evaluate any significant arrhythmias.  Follow-up in 6 to 8 weeks        Medication Adjustments/Labs and Tests Ordered: Current medicines are reviewed at length with the patient  today.  Concerns regarding medicines are outlined above.  Orders Placed This Encounter  Procedures   Sedimentation rate   C-reactive protein   EKG 12-Lead   ECHOCARDIOGRAM COMPLETE   Meds ordered this encounter  Medications   pantoprazole (PROTONIX) 40 MG tablet    Sig: Take 1 tablet (40 mg total) by mouth daily.    Dispense:  30 tablet    Refill:  11   ibuprofen (ADVIL) 200 MG tablet    Sig: Take 800 mg (4 pills) 3 times a day for 14  days, then take 800 mg (4 pills) twice daily for 7 days, then take 800 mg (4 pills) daily for 7 days    Dispense:  30 tablet    Refill:  0    Patient Instructions  Medication Instructions:   Your physician recommends that you continue on your current medications as directed. Please refer to the Current Medication list given to you today.   STOP taking celebrex (celecoxib)  START taking Protonix (pantoprazole) 40 mg daily   START taking Advil (ibuprofen) 800 mg (4 pills) 3 times a day for 14 days, then take 800 mg (4 pills) twice daily for 7 days, then take 800 mg (4 pills) daily for 7 days  *If you need a refill on your cardiac medications before your next appointment, please call your pharmacy*   Lab Work:  Your provider has ordered lab work (ESR, CRP) to be drawn in the office before you leave.   If you have labs (blood work) drawn today and your tests are completely normal, you will receive your results only by: [place] (if you have MyChart) OR A paper copy in the mail If you have any lab test that is abnormal or we need to change your treatment, we will call you to review the results.   Testing/Procedures:  Your physician has requested that you have an echocardiogram. Echocardiography is a painless test that uses sound waves to create images of your heart. It provides your doctor with information about the size and shape of your heart and how well your hearts chambers and valves are working. This procedure takes approximately  one hour. There are no restrictions for this procedure.   2. Your physician has recommended that you wear a Zio monitor for 14 days, this will be delivered to your home. This monitor is a medical device that records the hearts electrical activity. Doctors most often use these monitors to diagnose arrhythmias. Arrhythmias are problems with the speed or rhythm of the heartbeat. The monitor is a small device applied to your chest. You can wear one while you do your normal daily activities. While wearing this monitor if you have any symptoms to push the button and record what you felt. Once you have worn this monitor for the period of time provider prescribed (Usually 14 days), you will return the monitor device in the postage paid box. Once it is returned they will download the data collected and provide [place] with a report which the provider will then review and we will call you with those results. Important tips:  Avoid showering during the first 24 hours of wearing the monitor. Avoid excessive sweating to help maximize wear time. Do not submerge the device, no hot tubs, and no swimming pools. Keep any lotions or oils away from the patch. After 24 hours you may shower with the patch on. Take brief showers with your back facing the shower head.  Do not remove patch once it has been placed because that will interrupt data and decrease adhesive wear time. Push the button when you have any symptoms and write down what you were feeling. Once you have completed wearing your monitor, remove and place into box which has postage paid and place in your outgoing mailbox.  If for some reason you have misplaced your box then call our office and we can provide another box and/or mail it off for you.        Follow-Up: At [facility], you and your health needs are  our priority.  As part of our continuing mission to provide you with exceptional heart care, we have created designated Provider Care Teams.  These  Care Teams include your primary Cardiologist (physician) and Advanced Practice Providers (APPs -  Physician Assistants and Nurse Practitioners) who all work together to provide you with the care you need, when you need it.  We recommend signing up for the patient portal called "MyChart".  Sign up information is provided on this After Visit Summary.  MyChart is used to connect with patients for Virtual Visits (Telemedicine).  Patients are able to view lab/test results, encounter notes, upcoming appointments, etc.  Non-urgent messages can be sent to your provider as well.   To learn more about what you can do with MyChart, go to [email or web address].    Your next appointment:   6-8 week(s)  The format for your next appointment:   In Person  Provider:   You may see Brett Sable, MD or one of the following Advanced Practice Providers on your designated Care Team:   Murray Hodgkins, NP Christell Faith, PA-C Cadence Kathlen Mody, PA-C :1}    Other Instructions N/A    Signed, Brett Sable, MD  04/09/2021 12:53 PM    [place]

## 2021-04-09 NOTE — Progress Notes (Unsigned)
Enrolled patient for a 14 day Zio XT monitor to be mailed to patients home   Brett Robinson to read

## 2021-04-09 NOTE — Patient Instructions (Signed)
Medication Instructions:   Your physician recommends that you continue on your current medications as directed. Please refer to the Current Medication list given to you today.   STOP taking celebrex (celecoxib)  START taking Protonix (pantoprazole) 40 mg daily   START taking Advil (ibuprofen) 800 mg (4 pills) 3 times a day for 14 days, then take 800 mg (4 pills) twice daily for 7 days, then take 800 mg (4 pills) daily for 7 days  *If you need a refill on your cardiac medications before your next appointment, please call your pharmacy*   Lab Work:  Your provider has ordered lab work (ESR, CRP) to be drawn in the office before you leave.   If you have labs (blood work) drawn today and your tests are completely normal, you will receive your results only by: [place] (if you have MyChart) OR A paper copy in the mail If you have any lab test that is abnormal or we need to change your treatment, we will call you to review the results.   Testing/Procedures:  Your physician has requested that you have an echocardiogram. Echocardiography is a painless test that uses sound waves to create images of your heart. It provides your doctor with information about the size and shape of your heart and how well your hearts chambers and valves are working. This procedure takes approximately one hour. There are no restrictions for this procedure.   2. Your physician has recommended that you wear a Zio monitor for 14 days, this will be delivered to your home. This monitor is a medical device that records the hearts electrical activity. Doctors most often use these monitors to diagnose arrhythmias. Arrhythmias are problems with the speed or rhythm of the heartbeat. The monitor is a small device applied to your chest. You can wear one while you do your normal daily activities. While wearing this monitor if you have any symptoms to push the button and record what you felt. Once you have worn this monitor  for the period of time provider prescribed (Usually 14 days), you will return the monitor device in the postage paid box. Once it is returned they will download the data collected and provide [place] with a report which the provider will then review and we will call you with those results. Important tips:  Avoid showering during the first 24 hours of wearing the monitor. Avoid excessive sweating to help maximize wear time. Do not submerge the device, no hot tubs, and no swimming pools. Keep any lotions or oils away from the patch. After 24 hours you may shower with the patch on. Take brief showers with your back facing the shower head.  Do not remove patch once it has been placed because that will interrupt data and decrease adhesive wear time. Push the button when you have any symptoms and write down what you were feeling. Once you have completed wearing your monitor, remove and place into box which has postage paid and place in your outgoing mailbox.  If for some reason you have misplaced your box then call our office and we can provide another box and/or mail it off for you.        Follow-Up: At [facility], you and your health needs are our priority.  As part of our continuing mission to provide you with exceptional heart care, we have created designated Provider Care Teams.  These Care Teams include your primary Cardiologist (physician) and Advanced Practice Providers (APPs -  Physician Assistants  and Nurse Practitioners) who all work together to provide you with the care you need, when you need it.  We recommend signing up for the patient portal called "MyChart".  Sign up information is provided on this After Visit Summary.  MyChart is used to connect with patients for Virtual Visits (Telemedicine).  Patients are able to view lab/test results, encounter notes, upcoming appointments, etc.  Non-urgent messages can be sent to your provider as well.   To learn more about what you can do with  MyChart, go to [email or web address].    Your next appointment:   6-8 week(s)  The format for your next appointment:   In Person  Provider:   You may see Kate Sable, MD or one of the following Advanced Practice Providers on your designated Care Team:   Murray Hodgkins, NP Christell Faith, PA-C Cadence Kathlen Mody, PA-C :1}    Other Instructions N/A

## 2021-04-10 LAB — C-REACTIVE PROTEIN: CRP: 3 mg/L (ref 0–10)

## 2021-04-10 LAB — SEDIMENTATION RATE: Sed Rate: 4 mm/hr (ref 0–15)

## 2021-04-11 HISTORY — PX: SHOULDER CLOSED REDUCTION: SHX1051

## 2021-04-14 ENCOUNTER — Telehealth: Payer: Self-pay

## 2021-04-14 DIAGNOSIS — R079 Chest pain, unspecified: Secondary | ICD-10-CM

## 2021-04-14 DIAGNOSIS — R002 Palpitations: Secondary | ICD-10-CM

## 2021-04-14 NOTE — Telephone Encounter (Signed)
Patients Zio monitor that had been ordered by Dr. Azucena Cecil during OV on 04/09/2021 was registered to the wrong office [facility]). I called and spoke with Raynelle Fanning at [facility] and she corrected the order to reflect the [place] office. She did notice when reviewing the chart that 2 monitors were sent out to patient on the same day. She advised to have the patient put one of the unopened monitors directly back into the mail for return as their is a return postage label already attached on the box. She also requested that he inform us of the serial number on the monitor that he ends up using.  I called patient and informed him of the instructions. Patient stated that he was informed the monitor would be arriving today. I verbalized understanding of the instructions and agreed to call our office with the requested serial number after he receives it.

## 2021-04-15 ENCOUNTER — Telehealth: Payer: Self-pay | Admitting: Cardiology

## 2021-04-15 NOTE — Telephone Encounter (Signed)
Patient is calling back with the number that is on the monitor he is choosing to wear.  Number is: [id number]

## 2021-04-15 NOTE — Telephone Encounter (Signed)
Emailed Brett Robinson with [employer] with the serial number of the Zio monitor that the patient applied as requested. Raynelle Fanning informed me that she will also let me know when this monitor results we can make sure it is transferred into our basket correctly.

## 2021-04-15 NOTE — Telephone Encounter (Signed)
Another telephone encounter was created on 04/15/21. See that for continuation. Closing this encounter.

## 2021-04-22 ENCOUNTER — Encounter: Payer: Self-pay | Admitting: Internal Medicine

## 2021-04-22 ENCOUNTER — Other Ambulatory Visit: Payer: Self-pay

## 2021-04-22 ENCOUNTER — Ambulatory Visit (INDEPENDENT_AMBULATORY_CARE_PROVIDER_SITE_OTHER): Payer: Self-pay | Admitting: Internal Medicine

## 2021-04-22 VITALS — BP 114/76 | HR 75 | Temp 98.0°F | Ht 70.47 in | Wt 242.0 lb

## 2021-04-22 DIAGNOSIS — I319 Disease of pericardium, unspecified: Secondary | ICD-10-CM

## 2021-04-22 DIAGNOSIS — E041 Nontoxic single thyroid nodule: Secondary | ICD-10-CM

## 2021-04-22 LAB — URINALYSIS, ROUTINE W REFLEX MICROSCOPIC
Bilirubin, UA: NEGATIVE
Glucose, UA: NEGATIVE
Ketones, UA: NEGATIVE
Leukocytes,UA: NEGATIVE
Nitrite, UA: NEGATIVE
Protein,UA: NEGATIVE
Specific Gravity, UA: >1.03 — ABNORMAL HIGH (ref 1.005–1.030)
Urobilinogen, Ur: 0.2 mg/dL (ref 0.2–1.0)
pH, UA: 5.5 (ref 5.0–7.5)

## 2021-04-22 MED ORDER — HYDROXYZINE PAMOATE 25 MG PO CAPS: 25.0000 mg | ORAL_CAPSULE | Freq: Three times a day (TID) | ORAL | 0 refills | Status: DC | PRN

## 2021-04-22 MED ORDER — CYCLOBENZAPRINE HCL 5 MG PO TABS: 5.0000 mg | ORAL_TABLET | Freq: Three times a day (TID) | ORAL | 0 refills | Status: DC | PRN

## 2021-04-22 NOTE — Patient Instructions (Signed)
Mindfulness-Based Stress Reduction Mindfulness-based stress reduction (MBSR) is a program that helps people learn to practice mindfulness. Mindfulness is the practice of consciously paying attention to the present moment. MBSR focuses on developing self-awareness, which lets you respond to life stress without judgment or negative feelings. It can be learned and practiced through techniques such as education, breathing exercises, meditation, and yoga. MBSR includes several mindfulness techniques in one program. MBSR works best when you understand the treatment, are willing to try new things, and can commit to spending time practicing what you learn. MBSR training may include learning about: How your feelings, thoughts, and reactions affect your body. New ways to respond to things that cause negative thoughts to start (triggers). How to notice your thoughts and let go of them. Practicing awareness of everyday things that you normally do without thinking. The techniques and goals of different types of meditation. What are the benefits of MBSR? MBSR can have many benefits, which include helping you to: Develop self-awareness. This means knowing and understanding yourself. Learn skills and attitudes that help you to take part in your own health care. Learn new ways to care for yourself. Be more accepting about how things are, and let things go. Be less judgmental and approach things with an open mind. Be patient with yourself and trust yourself more. MBSR has also been shown to: Reduce negative emotions, such as sadness, overwhelm, and worry. Improve memory and focus. Change how you sense and react to pain. Boost your body's ability to fight infections. Help you connect better with other people. Improve your sense of well-being. How to practice mindfulness To do a basic awareness exercise: Find a comfortable place to sit. Pay attention to the present moment. Notice your thoughts, feelings, and  surroundings just as they are. Avoid judging yourself, your feelings, or your surroundings. Make note of any judgment that comes up and let it go. Your mind may wander, and that is okay. Make note of when your thoughts drift, and return your attention to the present moment. To do basic mindfulness meditation: Find a comfortable place to sit. This may include a stable chair or a firm floor cushion. Sit upright with your back straight. Let your arms fall next to your sides, with your hands resting on your legs. If you are sitting in a chair, rest your feet flat on the floor. If you are sitting on a cushion, cross your legs in front of you. Keep your head in a neutral position with your chin dropped slightly. Relax your jaw and rest the tip of your tongue on the roof of your mouth. Drop your gaze to the floor or close your eyes. Breathe normally and pay attention to your breath. Feel the air moving in and out of your nose. Feel your belly expanding and relaxing with each breath. Your mind may wander, and that is okay. Make note of when your thoughts drift, and return your attention to your breath. Avoid judging yourself, your feelings, or your surroundings. Make note of any judgment or feelings that come up, let them go, and bring your attention back to your breath. When you are ready, lift your gaze or open your eyes. Pay attention to how your body feels after the meditation. Follow these instructions at home:  Find a local in-person or online MBSR program. Set aside some time regularly for mindfulness practice. Practice every day if you can. Even 10 minutes of practice is helpful. Find a mindfulness practice that works best for   you. This may include one or more of the following: Meditation. This involves focusing your mind on a certain thought or activity. Breathing awareness exercises. These help you to stay present by focusing on your breath. Body scan. For this practice, you lie down and pay  attention to each part of your body from head to toe. You can identify tension and soreness and consciously relax parts of your body. Yoga. Yoga involves stretching and breathing, and it can improve your ability to move and be flexible. It can also help you to test your body's limits, which can help you release stress. Mindful eating. This way of eating involves focusing on the taste, texture, color, and smell of each bite of food. This slows down eating and helps you feel full sooner. For this reason, it can be an important part of a weight loss plan. Find a podcast or recording that provides guidance for breathing awareness, body scan, or meditation exercises. You can listen to these any time when you have a free moment to rest without distractions. Follow your treatment plan as told by your health care provider. This may include taking regular medicines and making changes to your diet or lifestyle as recommended. Where to find more information You can find more information about MBSR from: Your health care provider. Community-based meditation centers or programs. Programs offered near you. Summary Mindfulness-based stress reduction (MBSR) is a program that teaches you how to consciously pay attention to the present moment. It is used to help you deal better with daily stress, feelings, and pain. MBSR focuses on developing self-awareness, which allows you to respond to life stress without judgment or negative feelings. MBSR programs may involve learning different mindfulness practices, such as breathing exercises, meditation, yoga, body scan, or mindful eating. Find a mindfulness practice that works best for you, and set aside time for it on a regular basis. This information is not intended to replace advice given to you by your health care provider. Make sure you discuss any questions you have with your health care provider. Document Revised: 11/05/2020 Document Reviewed: 11/05/2020 Elsevier  Patient Education  2022 [employer]  

## 2021-04-22 NOTE — Progress Notes (Signed)
BP 114/76    Pulse 75    Temp 98 F (36.7 C) (Oral)    Ht 5' 10.47" (1.79 m)    Wt 242 lb (109.8 kg)    SpO2 98%    BMI 34.26 kg/m    Subjective:    Patient ID: Brett Robinson, male    DOB: [birth year], 36 y.o.   MRN: [id number]  Chief Complaint  Patient presents with   Pericarditis    Has seen cards on 1/4 here for follow up    HPI: Brett Robinson is a 36 y.o. 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  When he was working on his [employer] to change out things was extremely sore.   Chest Pain  This is a recurrent (atypical chest pain seen cards for such CT -ve) problem. Episode onset: pain that goes around the chest , says pain radiates to the shoulder blades. The onset quality is gradual (has had a 14 day Zio XT monitor that came in monday this week). The pain is present in the lateral region. The quality of the pain is described as tightness. The pain radiates to the precordial region. Pertinent negatives include no abdominal pain, back pain, claudication, cough, diaphoresis, dizziness, exertional chest pressure, fever, headaches, hemoptysis, irregular heartbeat, leg pain, lower extremity edema, malaise/fatigue, nausea, near-syncope, orthopnea, palpitations, PND, shortness of breath, sputum production, syncope, vomiting or weakness.   Chief Complaint  Patient presents with   Pericarditis    Has seen cards on 1/4 here for follow up    Relevant past medical, surgical, family and social history reviewed and updated as indicated. Interim medical history since our last visit reviewed. Allergies and medications reviewed and updated.  Review of Systems  Constitutional:  Negative for diaphoresis, fever and malaise/fatigue.  Respiratory:  Negative for cough, hemoptysis, sputum production and shortness of breath.   Cardiovascular:  Positive for chest pain. Negative for palpitations, orthopnea, claudication, syncope, PND and near-syncope.   Gastrointestinal:  Negative for abdominal pain, nausea and vomiting.  Musculoskeletal:  Negative for back pain.  Neurological:  Negative for dizziness, weakness and headaches.   Per HPI unless specifically indicated above     Objective:    BP 114/76    Pulse 75    Temp 98 F (36.7 C) (Oral)    Ht 5' 10.47" (1.79 m)    Wt 242 lb (109.8 kg)    SpO2 98%    BMI 34.26 kg/m   Wt Readings from Last 3 Encounters:  04/22/21 242 lb (109.8 kg)  04/09/21 235 lb (106.6 kg)  04/08/21 236 lb 12.8 oz (107.4 kg)    Physical Exam Vitals and nursing note reviewed.  Constitutional:      General: He is not in acute distress.    Appearance: Normal appearance. He is not ill-appearing or diaphoretic.  HENT:     Head: Normocephalic and atraumatic.     Right Ear: Tympanic membrane and external ear normal. There is no impacted cerumen.     Left Ear: External ear normal.     Nose: No congestion or rhinorrhea.     Mouth/Throat:     Pharynx: No oropharyngeal exudate or posterior oropharyngeal erythema.  Eyes:     Conjunctiva/sclera: Conjunctivae normal.     Pupils: Pupils are equal, round, and reactive to light.  Cardiovascular:     Rate and Rhythm: Normal rate and regular rhythm.     Heart sounds: No murmur heard.  No friction rub. No gallop.  Pulmonary:     Effort: No respiratory distress.     Breath sounds: No stridor. No wheezing or rhonchi.  Chest:     Chest wall: No tenderness.  Musculoskeletal:     Cervical back: Normal range of motion and neck supple. No rigidity or tenderness.     Left lower leg: No edema.  Skin:    General: Skin is warm and dry.  Neurological:     Mental Status: He is alert.    Results for orders placed or performed in visit on 04/09/21  Sedimentation rate  Result Value Ref Range   Sed Rate 4 0 - 15 mm/hr  C-reactive protein  Result Value Ref Range   CRP 3 0 - 10 mg/L        Current Outpatient Medications:    acetaminophen (TYLENOL) 500 MG tablet, Take  500-1,000 mg by mouth daily., Disp: , Rfl:    colchicine 0.6 MG tablet, Take 0.6 mg by mouth daily., Disp: , Rfl:    cyclobenzaprine (FLEXERIL) 10 MG tablet, Take 1 tablet (10 mg total) by mouth 3 (three) times daily as needed for muscle spasms., Disp: 30 tablet, Rfl: 0   ibuprofen (ADVIL) 200 MG tablet, Take 800 mg (4 pills) 3 times a day for 14 days, then take 800 mg (4 pills) twice daily for 7 days, then take 800 mg (4 pills) daily for 7 days, Disp: 30 tablet, Rfl: 0   ondansetron (ZOFRAN) 8 MG tablet, Take 1 tablet (8 mg total) by mouth every 8 (eight) hours as needed for nausea or vomiting., Disp: 20 tablet, Rfl: 0   OVER THE COUNTER MEDICATION, Take 2 Pieces by mouth daily. Elderberry Vitamin C zinc gummies, Disp: , Rfl:    pantoprazole (PROTONIX) 40 MG tablet, Take 1 tablet (40 mg total) by mouth daily., Disp: 30 tablet, Rfl: 11    Assessment & Plan:  Chest pain atypical :  no pericarditis per cards notes. s/p colchicine asked to stop taking such.  ? GERD vs musculoskeletal pain   is on ibubrufen 800 mg tid x 2 weeks.  Is on protonix 40 mg.  ECHO - this month end.   Thyroid nodule :  on CT CHEST  Check US thyroid  and TSH / Ft4

## 2021-04-23 LAB — COMPREHENSIVE METABOLIC PANEL
ALT: 16 IU/L (ref 0–44)
AST: 10 IU/L (ref 0–40)
Albumin/Globulin Ratio: 2 (ref 1.2–2.2)
Albumin: 4.7 g/dL (ref 4.0–5.0)
Alkaline Phosphatase: 95 IU/L (ref 44–121)
BUN/Creatinine Ratio: 14 (ref 9–20)
BUN: 14 mg/dL (ref 6–20)
Bilirubin Total: 0.2 mg/dL (ref 0.0–1.2)
CO2: 26 mmol/L (ref 20–29)
Calcium: 10 mg/dL (ref 8.7–10.2)
Chloride: 103 mmol/L (ref 96–106)
Creatinine, Ser: 1.01 mg/dL (ref 0.76–1.27)
Globulin, Total: 2.4 g/dL (ref 1.5–4.5)
Glucose: 95 mg/dL (ref 70–99)
Potassium: 4.8 mmol/L (ref 3.5–5.2)
Sodium: 141 mmol/L (ref 134–144)
Total Protein: 7.1 g/dL (ref 6.0–8.5)
eGFR: 99 mL/min/{1.73_m2} (ref >59)

## 2021-04-23 LAB — CBC WITH DIFFERENTIAL/PLATELET
Basophils Absolute: 0 10*3/uL (ref 0.0–0.2)
Basos: 0 %
EOS (ABSOLUTE): 0.1 10*3/uL (ref 0.0–0.4)
Eos: 2 %
Hematocrit: 42.4 % (ref 37.5–51.0)
Hemoglobin: 14.4 g/dL (ref 13.0–17.7)
Immature Grans (Abs): 0 10*3/uL (ref 0.0–0.1)
Immature Granulocytes: 0 %
Lymphocytes Absolute: 1.6 10*3/uL (ref 0.7–3.1)
Lymphs: 28 %
MCH: 29.9 pg (ref 26.6–33.0)
MCHC: 34 g/dL (ref 31.5–35.7)
MCV: 88 fL (ref 79–97)
Monocytes Absolute: 0.3 10*3/uL (ref 0.1–0.9)
Monocytes: 6 %
Neutrophils Absolute: 3.5 10*3/uL (ref 1.4–7.0)
Neutrophils: 64 %
Platelets: 262 10*3/uL (ref 150–450)
RBC: 4.81 x10E6/uL (ref 4.14–5.80)
RDW: 12.6 % (ref 11.6–15.4)
WBC: 5.5 10*3/uL (ref 3.4–10.8)

## 2021-04-23 LAB — LIPID PANEL
Chol/HDL Ratio: 4.7 ratio (ref 0.0–5.0)
Cholesterol, Total: 147 mg/dL (ref 100–199)
HDL: 31 mg/dL — ABNORMAL LOW (ref >39)
LDL Chol Calc (NIH): 96 mg/dL (ref 0–99)
Triglycerides: 109 mg/dL (ref 0–149)
VLDL Cholesterol Cal: 20 mg/dL (ref 5–40)

## 2021-04-23 LAB — TSH: TSH: 1.01 u[IU]/mL (ref 0.450–4.500)

## 2021-04-23 LAB — T4, FREE: Free T4: 1.55 ng/dL (ref 0.82–1.77)

## 2021-04-27 ENCOUNTER — Ambulatory Visit (INDEPENDENT_AMBULATORY_CARE_PROVIDER_SITE_OTHER): Payer: Self-pay

## 2021-04-27 ENCOUNTER — Other Ambulatory Visit: Payer: Self-pay

## 2021-04-27 DIAGNOSIS — R079 Chest pain, unspecified: Secondary | ICD-10-CM

## 2021-04-27 LAB — ECHOCARDIOGRAM COMPLETE
AR max vel: 3.27 cm2
AV Area VTI: 2.94 cm2
AV Area mean vel: 3.22 cm2
AV Mean grad: 5 mmHg
AV Peak grad: 9.7 mmHg
Ao pk vel: 1.56 m/s
Area-P 1/2: 2.92 cm2
Calc EF: 52.7 %
S' Lateral: 4.1 cm
Single Plane A2C EF: 52.7 %
Single Plane A4C EF: 52.8 %

## 2021-04-28 ENCOUNTER — Encounter: Payer: Self-pay | Admitting: Cardiology

## 2021-04-29 ENCOUNTER — Ambulatory Visit: Payer: Self-pay

## 2021-04-29 ENCOUNTER — Ambulatory Visit: Payer: Self-pay | Admitting: Internal Medicine

## 2021-04-29 ENCOUNTER — Ambulatory Visit: Payer: Medicaid Other | Admitting: Internal Medicine

## 2021-04-29 ENCOUNTER — Encounter: Payer: Self-pay | Admitting: Internal Medicine

## 2021-04-29 ENCOUNTER — Other Ambulatory Visit: Payer: Self-pay | Admitting: Internal Medicine

## 2021-04-29 DIAGNOSIS — R0789 Other chest pain: Secondary | ICD-10-CM

## 2021-04-29 NOTE — Telephone Encounter (Signed)
Chief Complaint: Upper abdominal pain/Acid reflux Symptoms: Burning in chest and upper abdomen Frequency: Ongoing x 6 months Pertinent Negatives: Patient denies CP, nausea, vomiting, radiating pain Disposition: [] ED /[] Urgent Care (no appt availability in office) / [] Appointment(In office/virtual)/ []  [facility]/ [] Home Care/ [x] Refused Recommended Disposition /[] [facility]/ [x]  Follow-up with PCP Additional Notes: Patient is requesting GI cocktail, says PPE he takes is not helping and Tums is not helping. He says GI appt is months out and he needs relief now, can't sleep due to the indigestion. He says he already sent message to Dr. Neomia Dear and she said he doesn't need to be seen in the office. He asks if she could send in the GI cocktail. Advised I will send this to her for recommendation and someone will call back.    Summary: medication request   Calling to see if pcp can give a GI Cocktail, seeking medical advice      Reason for Disposition  [1] MILD-MODERATE pain AND [2] not relieved by antacids  Answer Assessment - Initial Assessment Questions 1. LOCATION: "Where does it hurt?"      Top of stomach below sternum on the left side 2. RADIATION: "Does the pain shoot anywhere else?" (e.g., chest, back)     Goes up into chest burning 3. ONSET: "When did the pain begin?" (e.g., minutes, hours or days ago)       6 months ago, but is getting worse as time goes on 4. SUDDEN: "Gradual or sudden onset?"     Gradual 5. PATTERN "Does the pain come and go, or is it constant?"    - If constant: "Is it getting better, staying the same, or worsening?"      (Note: Constant means the pain never goes away completely; most serious pain is constant and it progresses)     - If intermittent: "How long does it last?" "Do you have pain now?"     (Note: Intermittent means the pain goes away completely between bouts)     Constant heartburn 6. SEVERITY: "How bad is the pain?"  (e.g.,  Scale 1-10; mild, moderate, or severe)    - MILD (1-3): doesn't interfere with normal activities, abdomen soft and not tender to touch     - MODERATE (4-7): interferes with normal activities or awakens from sleep, abdomen tender to touch     - SEVERE (8-10): excruciating pain, doubled over, unable to do any normal activities       6 7. RECURRENT SYMPTOM: "Have you ever had this type of stomach pain before?" If Yes, ask: "When was the last time?" and "What happened that time?"      Yes, acid reflux 8. AGGRAVATING FACTORS: "Does anything seem to cause this pain?" (e.g., foods, stress, alcohol)     Foogs 9. CARDIAC SYMPTOMS: "Do you have any of the following symptoms: chest pain, difficulty breathing, sweating, nausea?"     Yes SOB x 6 months (seen by cardiology, ruled out cardiac) 10. OTHER SYMPTOMS: "Do you have any other symptoms?" (e.g., back pain, diarrhea, fever, urination pain, vomiting)       No 11. PREGNANCY: "Is there any chance you are pregnant?" "When was your last menstrual period?"       N/A  Protocols used: Abdominal Pain - Upper-A-AH

## 2021-04-29 NOTE — Progress Notes (Signed)
Stop Ibubrufen and will refer to GI  Pt has had an extensive cardiac wu and the Cardiologist notes indicate he doesn't have pericarditis ECHO was wnl. Has had a normal Ct angio which only shows thyroid nodule which he is getting evaluated with an US thyroid Fu with me as scheuduled Consider Endocrinology referral if has abnl [place]   Brett Robinson

## 2021-04-29 NOTE — Telephone Encounter (Signed)
Pt scheduled for today at 2:40. ?

## 2021-04-30 NOTE — Telephone Encounter (Signed)
Pt states he only wants to see Dr. Charlotta Newton and he says that he was told by our office that he didn't need an appt. He is asking for a cocktail for heartburn. He also wants to know if someone can call GI to see about getting an earlier appt with them.

## 2021-05-03 ENCOUNTER — Ambulatory Visit
Admission: RE | Admit: 2021-05-03 | Discharge: 2021-05-03 | Disposition: A | Discharge status: Home / Self Care | Payer: Self-pay | Source: Ambulatory Visit | Attending: Internal Medicine | Admitting: Internal Medicine

## 2021-05-03 ENCOUNTER — Other Ambulatory Visit: Payer: Self-pay

## 2021-05-03 DIAGNOSIS — E041 Nontoxic single thyroid nodule: Secondary | ICD-10-CM | POA: Insufficient documentation

## 2021-05-03 NOTE — Telephone Encounter (Signed)
Patient made aware that he has a GI appt with Dr. Deborah Chalk at 1:30pm on 05/10/21. Patient verbalized understanding

## 2021-05-03 NOTE — Progress Notes (Signed)
Please let pt know he has a multinodular goiter, will need a follow up US in x 1 year given normal TSH/ FT4 levels. thnx

## 2021-05-10 ENCOUNTER — Other Ambulatory Visit: Payer: Medicaid Other

## 2021-05-13 ENCOUNTER — Encounter: Payer: Self-pay | Admitting: Internal Medicine

## 2021-05-14 ENCOUNTER — Ambulatory Visit: Payer: Self-pay | Admitting: Internal Medicine

## 2021-05-17 NOTE — Telephone Encounter (Signed)
Pl see what he needs - I think this has been addressed thnx.

## 2021-05-21 ENCOUNTER — Ambulatory Visit: Payer: Self-pay | Admitting: Cardiology

## 2021-05-26 ENCOUNTER — Encounter: Payer: Self-pay | Admitting: Internal Medicine

## 2021-05-31 ENCOUNTER — Encounter: Payer: Self-pay | Admitting: Internal Medicine

## 2021-05-31 ENCOUNTER — Telehealth: Payer: Self-pay | Admitting: Internal Medicine

## 2021-05-31 ENCOUNTER — Telehealth (INDEPENDENT_AMBULATORY_CARE_PROVIDER_SITE_OTHER): Payer: Self-pay | Admitting: Internal Medicine

## 2021-05-31 DIAGNOSIS — F419 Anxiety disorder, unspecified: Secondary | ICD-10-CM | POA: Insufficient documentation

## 2021-05-31 MED ORDER — CITALOPRAM HYDROBROMIDE 10 MG PO TABS: 10.0000 mg | ORAL_TABLET | Freq: Every day | ORAL | 1 refills | Status: DC

## 2021-05-31 NOTE — Progress Notes (Signed)
There were no vitals taken for this visit.   Subjective:    Patient ID: Brett Robinson, male    DOB: 11-23-1985, 36 y.o.   MRN: [id number]  Chief Complaint  Patient presents with   Anxiety    Patient would like a different medication other than Hydroxyzine, it makes his sleeping and does not work for his anxiety    HPI: Brett Robinson is a 36 y.o. male  Chest pain hurts him and says he has pain when he excercises and gets Sob has to stop and go outside and this helps. Took hydroxyzine for anxiety just puts him to sleep .  When he takes a deep breath brings on his chest pain     This visit was completed via video visit through MyChart due to the restrictions of the COVID-19 pandemic. All issues as above were discussed and addressed. Physical exam was done as above through visual confirmation on video through MyChart. If it was felt that the patient should be evaluated in the office, they were directed there. The patient verbally consented to this visit. Location of the patient: home Location of the provider: work Those involved with this call:  Provider: Loura Pardon, MD CMA: Tristan Schroeder, CMA Front Desk/Registration: Channing Mutters  Time spent on call: 15 minutes with patient face to face via video conference. More than 50% of this time was spent in counseling and coordination of care. 15 minutes total spent in review of patient's record and preparation of their chart.    Anxiety Presents for follow-up visit. Symptoms include chest pain and nervous/anxious behavior. Patient reports no compulsions, confusion, decreased concentration, depressed mood, dizziness, dry mouth, excessive worry, feeling of choking, hyperventilation, impotence, insomnia, irritability, malaise, muscle tension, nausea, obsessions, palpitations, panic, restlessness, shortness of breath or suicidal ideas. The severity of symptoms is mild.     Chief Complaint  Patient presents with   Anxiety     Patient would like a different medication other than Hydroxyzine, it makes his sleeping and does not work for his anxiety    Relevant past medical, surgical, family and social history reviewed and updated as indicated. Interim medical history since our last visit reviewed. Allergies and medications reviewed and updated.  Review of Systems  Constitutional:  Negative for irritability.  Respiratory:  Negative for shortness of breath.   Cardiovascular:  Positive for chest pain. Negative for palpitations.  Gastrointestinal:  Negative for nausea.  Genitourinary:  Negative for impotence.  Neurological:  Negative for dizziness.  Psychiatric/Behavioral:  Negative for confusion, decreased concentration and suicidal ideas. The patient is nervous/anxious. The patient does not have insomnia.    Per HPI unless specifically indicated above     Objective:    There were no vitals taken for this visit.  Wt Readings from Last 3 Encounters:  04/22/21 242 lb (109.8 kg)  04/09/21 235 lb (106.6 kg)  04/08/21 236 lb 12.8 oz (107.4 kg)    Physical Exam Constitutional:      General: He is not in acute distress.    Appearance: He is not ill-appearing, toxic-appearing or diaphoretic.  HENT:     Head: Normocephalic and atraumatic.     Nose: No congestion or rhinorrhea.  Neurological:     Mental Status: He is alert.  Psychiatric:        Mood and Affect: Mood normal.        Behavior: Behavior normal.        Judgment: Judgment normal.    Results for  orders placed or performed in visit on 04/27/21  ECHOCARDIOGRAM COMPLETE  Result Value Ref Range   AR max vel 3.27 cm2   AV Peak grad 9.7 mmHg   Ao pk vel 1.56 m/s   S' Lateral 4.10 cm   Area-P 1/2 2.92 cm2   AV Area VTI 2.94 cm2   AV Mean grad 5.0 mmHg   Single Plane A4C EF 52.8 %   Single Plane A2C EF 52.7 %   Calc EF 52.7 %   AV Area mean vel 3.22 cm2        Current Outpatient Medications:    acetaminophen (TYLENOL) 500 MG tablet, Take  500-1,000 mg by mouth daily., Disp: , Rfl:    citalopram (CELEXA) 10 MG tablet, Take 1 tablet (10 mg total) by mouth daily., Disp: 30 tablet, Rfl: 1   cyclobenzaprine (FLEXERIL) 5 MG tablet, Take 1 tablet (5 mg total) by mouth 3 (three) times daily as needed for muscle spasms (only prn not while driving)., Disp: 30 tablet, Rfl: 0   hydrOXYzine (VISTARIL) 25 MG capsule, Take 1 capsule (25 mg total) by mouth every 8 (eight) hours as needed for anxiety., Disp: 30 capsule, Rfl: 0   OVER THE COUNTER MEDICATION, Take 2 Pieces by mouth daily. Elderberry Vitamin C zinc gummies, Disp: , Rfl:    pantoprazole (PROTONIX) 40 MG tablet, Take 1 tablet (40 mg total) by mouth daily., Disp: 30 tablet, Rfl: 11    Assessment & Plan:  Anxiety : ? Chest pain sec to such pt has had extensive cardiac wu including an ECHO wnl, normal CTPA seen cards and deemed -ve for cardiac etiology will start celexa 10 mg daily  Increase to 20 mg  2 weeks.  Fu with me x 3 weeks virtually potential Side effects dw pt. to call office if develops any SE. will fu with me in 1 month for such. pt verbalised understanding.        Problem List Items Addressed This Visit       Other   Anxiety - Primary   Relevant Medications   citalopram (CELEXA) 10 MG tablet     No orders of the defined types were placed in this encounter.    Meds ordered this encounter  Medications   citalopram (CELEXA) 10 MG tablet    Sig: Take 1 tablet (10 mg total) by mouth daily.    Dispense:  30 tablet    Refill:  1     Follow up plan: No follow-ups on file.1

## 2021-06-16 ENCOUNTER — Ambulatory Visit: Payer: Self-pay | Admitting: Nurse Practitioner

## 2021-06-17 ENCOUNTER — Other Ambulatory Visit: Payer: Self-pay

## 2021-06-17 ENCOUNTER — Encounter: Payer: Self-pay | Admitting: Cardiology

## 2021-06-17 ENCOUNTER — Ambulatory Visit (INDEPENDENT_AMBULATORY_CARE_PROVIDER_SITE_OTHER): Payer: Self-pay | Admitting: Cardiology

## 2021-06-17 VITALS — BP 120/72 | HR 50 | Ht 70.0 in | Wt 239.0 lb

## 2021-06-17 DIAGNOSIS — R002 Palpitations: Secondary | ICD-10-CM

## 2021-06-17 DIAGNOSIS — R079 Chest pain, unspecified: Secondary | ICD-10-CM

## 2021-06-17 NOTE — Patient Instructions (Signed)
Medication Instructions:  ?Your physician recommends that you continue on your current medications as directed. Please refer to the Current Medication list given to you today. ? ?*If you need a refill on your cardiac medications before your next appointment, please call your pharmacy* ? ? ?Lab Work: ?None ordered ?If you have labs (blood work) drawn today and your tests are completely normal, you will receive your results only by: ?MyChart Message (if you have MyChart) OR ?A paper copy in the mail ?If you have any lab test that is abnormal or we need to change your treatment, we will call you to review the results. ? ? ?Testing/Procedures: ?None ordered ? ? ?Follow-Up: ?At [facility], you and your health needs are our priority.  As part of our continuing mission to provide you with exceptional heart care, we have created designated Provider Care Teams.  These Care Teams include your primary Cardiologist (physician) and Advanced Practice Providers (APPs -  Physician Assistants and Nurse Practitioners) who all work together to provide you with the care you need, when you need it. ? ?We recommend signing up for the patient portal called "MyChart".  Sign up information is provided on this After Visit Summary.  MyChart is used to connect with patients for Virtual Visits (Telemedicine).  Patients are able to view lab/test results, encounter notes, upcoming appointments, etc.  Non-urgent messages can be sent to your provider as well.   ?To learn more about what you can do with MyChart, go to [email or web address].   ? ?Your next appointment:   ?As needed ? ?The format for your next appointment:   ?In Person ? ?Provider:   ?You may see Brian Agbor-Etang, MD or one of the following Advanced Practice Providers on your designated Care Team:   ?Chey Berge, NP ?Ryan Dunn, PA-C ?Cadence Furth, PA-C{ ? ? ?Other Instructions ?N/A ? ?

## 2021-06-17 NOTE — Progress Notes (Signed)
?Cardiology Office Note:   ? ?Date:  06/17/2021  ? ?ID:  Brett Robinson, DOB [birth year], MRN [id number] ? ?PCP:  Charlynne Cousins, MD ?  ?[facility] Providers ?Cardiologist:  Kate Sable, MD    ? ?Referring MD: Charlynne Cousins, MD  ? ?Chief Complaint  ?Patient presents with  ? Other  ?  6-8 week follow up -- Patient c.o chest pain, SOB and swelling in ankles. Meds reviewed verbally with patient.   ? ? ?History of Present Illness:   ? ?Brett Robinson is a 36 y.o. male with a hx of GERD, former smoker x10 years who presents for follow-up.  Previously seen due to chest pain and palpitations. ? ?Echocardiogram and cardiac monitor was obtained to evaluate any significant cardiac function, arrhythmias.  Chest pain is reproducible with palpation.  Sometimes improves when he does stretching exercises on the wall.  ESR and CRP was obtained which were normal. ? ? ?Past Medical History:  ?Diagnosis Date  ? Anxiety   ? For a while because of medical issues that no one can figure out  ? GERD (gastroesophageal reflux disease)   ? Neuromuscular disorder ([place])   ? Not sure on the date  ? Sleep apnea   ? ? ?Past Surgical History:  ?Procedure Laterality Date  ? skin graft    ? tubes in ears    ? ? ?Current Medications: ?Current Meds  ?Medication Sig  ? acetaminophen (TYLENOL) 500 MG tablet Take 500-1,000 mg by mouth daily.  ? citalopram (CELEXA) 10 MG tablet Take 1 tablet (10 mg total) by mouth daily.  ? cyclobenzaprine (FLEXERIL) 5 MG tablet Take 1 tablet (5 mg total) by mouth 3 (three) times daily as needed for muscle spasms (only prn not while driving).  ? hydrOXYzine (VISTARIL) 25 MG capsule Take 1 capsule (25 mg total) by mouth every 8 (eight) hours as needed for anxiety.  ? OVER THE COUNTER MEDICATION Take 2 Pieces by mouth daily. Elderberry Vitamin C zinc gummies  ? pantoprazole (PROTONIX) 40 MG tablet Take 1 tablet (40 mg total) by mouth daily.  ?  ? ?Allergies:   Celebrex [celecoxib] and Nsaids  ? ?Social History   ? ?Socioeconomic History  ? Marital status: Single  ?  Spouse name: Not on file  ? Number of children: Not on file  ? Years of education: Not on file  ? Highest education level: Not on file  ?Occupational History  ? Not on file  ?Tobacco Use  ? Smoking status: Former  ?  Packs/day: 1.00  ?  Years: 5.00  ?  Pack years: 5.00  ?  Types: Cigarettes  ?  Quit date: 05/15/2014  ?  Years since quitting: 7.0  ? Smokeless tobacco: Current  ?  Types: Chew  ?Vaping Use  ? Vaping Use: Former  ?Substance and Sexual Activity  ? Alcohol use: Not Currently  ? Drug use: Not Currently  ? Sexual activity: Yes  ?  Birth control/protection: Condom  ?Other Topics Concern  ? Not on file  ?Social History Narrative  ? Not on file  ? ?Social Determinants of Health  ? ?Financial Resource Strain: Not on file  ?Food Insecurity: Not on file  ?Transportation Needs: Not on file  ?Physical Activity: Not on file  ?Stress: Not on file  ?Social Connections: Not on file  ?  ? ?Family History: ?The patient's family history includes Colitis in his sister; Gallbladder disease in his mother and sister; Heart disease in his father; Hiatal hernia  in his sister; Hypertension in his brother; Stroke in his father; Thyroid disease in his mother. ? ?ROS:   ?Please see the history of present illness.    ? All other systems reviewed and are negative. ? ?EKGs/Labs/Other Studies Reviewed:   ? ?The following studies were reviewed today: ? ? ?EKG:  EKG is  ordered today.  The ekg ordered today demonstrates sinus bradycardia ? ?Recent Labs: ?04/22/2021: ALT 16; BUN 14; Creatinine, Ser 1.01; Hemoglobin 14.4; Platelets 262; Potassium 4.8; Sodium 141; TSH 1.010  ?Recent Lipid Panel ?   ?Component Value Date/Time  ? CHOL 147 04/22/2021 0905  ? TRIG 109 04/22/2021 0905  ? HDL 31 (L) 04/22/2021 0905  ? CHOLHDL 4.7 04/22/2021 0905  ? [place] 96 04/22/2021 0905  ? ? ? ?Risk Assessment/Calculations:   ? ? ?    ? ?Physical Exam:   ? ?VS:  BP 120/72 (BP Location: Left Arm, Patient  Position: Sitting, Cuff Size: Normal)   Pulse (!) 50   Ht _0  (1.778 m)   Wt 239 lb (108.4 kg)   BMI 34.29 kg/m?    ? ?Wt Readings from Last 3 Encounters:  ?06/17/21 239 lb (108.4 kg)  ?04/22/21 242 lb (109.8 kg)  ?04/09/21 235 lb (106.6 kg)  ?  ? ?GEN:  Well nourished, well developed in no acute distress ?HEENT: Normal ?NECK: No JVD; No carotid bruits ?LYMPHATICS: No lymphadenopathy ?CARDIAC: RRR, no murmurs, rubs, gallops ?RESPIRATORY:  Clear to auscultation without rales, wheezing or rhonchi  ?ABDOMEN: Soft, non-tender, non-distended ?MUSCULOSKELETAL:  No edema; chest tenderness noted with palpation. ?SKIN: Warm and dry ?NEUROLOGIC:  Alert and oriented x 3 ?PSYCHIATRIC:  Normal affect  ? ?ASSESSMENT:   ? ?1. Chest pain, unspecified type   ?2. Palpitations   ? ? ?PLAN:   ? ?In order of problems listed above: ? ?Chest pain, reproducible with palpation indicating musculoskeletal etiology.  ESR CRP normal.  Echo shows normal systolic and diastolic function, no pericardial effusion .  Symptoms not consistent with angina, stress testing not indicated. ?Palpitations, cardiac monitor with no significant arrhythmias.  1 episode of 10 beat VT not associated with patient triggered event.  Overall benign cardiac monitor. ? ?Follow-up as needed ?   ?Medication Adjustments/Labs and Tests Ordered: ?Current medicines are reviewed at length with the patient today.  Concerns regarding medicines are outlined above.  ?Orders Placed This Encounter  ?Procedures  ? EKG 12-Lead  ? ?No orders of the defined types were placed in this encounter. ? ? ?Patient Instructions  ?Medication Instructions:  ? ?Your physician recommends that you continue on your current medications as directed. Please refer to the Current Medication list given to you today.  ? ?*If you need a refill on your cardiac medications before your next appointment, please call your pharmacy* ? ? ?Lab Work: ?None ordered ? ?If you have labs (blood work) drawn today and  your tests are completely normal, you will receive your results only by: ?MyChart Message (if you have MyChart) OR ?A paper copy in the mail ?If you have any lab test that is abnormal or we need to change your treatment, we will call you to review the results. ? ? ?Testing/Procedures: ?None ordered ? ? ?Follow-Up: ?At [facility], you and your health needs are our priority.  As part of our continuing mission to provide you with exceptional heart care, we have created designated Provider Care Teams.  These Care Teams include your primary Cardiologist (physician) and Advanced Practice Providers (APPs -  Physician Assistants and Nurse Practitioners) who all work together to provide you with the care you need, when you need it. ? ?We recommend signing up for the patient portal called "MyChart".  Sign up information is provided on this After Visit Summary.  MyChart is used to connect with patients for Virtual Visits (Telemedicine).  Patients are able to view lab/test results, encounter notes, upcoming appointments, etc.  Non-urgent messages can be sent to your provider as well.   ?To learn more about what you can do with MyChart, go to [email or web address].   ? ?Your next appointment:   ?As needed ? ?The format for your next appointment:   ?In Person ? ?Provider:   ?You may see Kate Sable, MD or one of the following Advanced Practice Providers on your designated Care Team:   ?Murray Hodgkins, NP ?Christell Faith, PA-C ?Cadence Kathlen Mody, PA-C ? ? ?Other Instructions ?N/A  ? ?Signed, ?Kate Sable, MD  ?06/17/2021 12:10 PM    ?[place] ?

## 2021-07-01 ENCOUNTER — Other Ambulatory Visit: Payer: Self-pay | Admitting: Internal Medicine

## 2021-07-03 NOTE — Telephone Encounter (Signed)
Requested medications are due for refill today.  yes ? ?Requested medications are on the active medications list.  yes ? ?Last refill. 05/31/2021 #30 1 refill ? ?Future visit scheduled.   no ? ?Notes to clinic.  Per note from 05/31/2021 pt was to follow up with provider - note says 3 weeks and 1 month. Please advise. ? ? ? ?Requested Prescriptions  ?Pending Prescriptions Disp Refills  ? citalopram (CELEXA) 10 MG tablet [Pharmacy Med Name: CITALOPRAM HBR 10 MG TABLET] 90 tablet 1  ?  Sig: TAKE 1 TABLET BY MOUTH EVERY DAY  ?  ? Psychiatry:  Antidepressants - SSRI Passed - 07/01/2021 12:31 PM  ?  ?  Passed - Valid encounter within last 6 months  ?  Recent Outpatient Visits   ? ?      ? 1 month ago Anxiety  ? [facility] Vigg, Avanti, MD  ? 2 months ago Thyroid nodule  ? [facility] Vigg, Avanti, MD  ? 2 months ago Pericarditis, unspecified chronicity, unspecified type  ? [facility] Vigg, Avanti, MD  ? ?  ?  ? ?  ?  ?  ?  ?

## 2021-07-05 ENCOUNTER — Ambulatory Visit: Payer: Medicaid Other | Admitting: Gastroenterology

## 2021-07-05 NOTE — Telephone Encounter (Signed)
Pt overdue for an appt. Lmom asking to call back to schedule an appt. ?

## 2021-07-07 NOTE — Telephone Encounter (Signed)
2nd attempt to reach pt

## 2021-07-26 ENCOUNTER — Encounter: Payer: Self-pay | Admitting: Internal Medicine

## 2021-07-26 ENCOUNTER — Ambulatory Visit (INDEPENDENT_AMBULATORY_CARE_PROVIDER_SITE_OTHER): Payer: Self-pay | Admitting: Internal Medicine

## 2021-07-26 VITALS — BP 123/83 | HR 94 | Temp 98.3°F | Ht 70.0 in | Wt 250.2 lb

## 2021-07-26 DIAGNOSIS — F419 Anxiety disorder, unspecified: Secondary | ICD-10-CM

## 2021-07-26 DIAGNOSIS — M546 Pain in thoracic spine: Secondary | ICD-10-CM

## 2021-07-26 DIAGNOSIS — G8929 Other chronic pain: Secondary | ICD-10-CM | POA: Insufficient documentation

## 2021-07-26 NOTE — Progress Notes (Signed)
? ?BP 123/83   Pulse 94   Temp 98.3 ?F (36.8 ?C) (Oral)   Ht 5\' 10"  (1.778 m)   Wt 250 lb 3.2 oz (113.5 kg)   SpO2 98%   BMI 35.90 kg/m?   ? ?Subjective:  ? ? Patient ID: Brett Robinson, male    DOB: 21-Jul-1985, 36 y.o.   MRN: [id number] ? ?Chief Complaint  ?Patient presents with  ?? Anxiety  ?  Was started on Celexa 10mg  the in 2 weeks take 20 mg on May 31, 2021, too the 10 mg for 2 weeks then stopped taking shortly after starting the 20mg  dose due to no energy, no motivation, stomach issues, not wanting to get out of bed, and starting to feel depressed  ? ? ?HPI: ?Brett Robinson is a 36 y.o. male ? ? ? ?Anxiety ?Presents for follow-up (celexa made him feel depressed.) visit. Symptoms include excessive worry and nervous/anxious behavior. Patient reports no chest pain, confusion, decreased concentration, feeling of choking, irritability, malaise, muscle tension or palpitations. Primary symptoms comment: still has anxiety now. has chest pain in his back in the middle of his spine..  ? ? ?Back Pain ?This is a chronic (has pain in the upper thoracic area. chest pain is better now ribs and arm pain is better back pain - worse now , works to build floors.) problem. The problem has been waxing and waning since onset. The pain is present in the thoracic spine. The pain is at a severity of 5/10. Associated symptoms include numbness and tingling. Pertinent negatives include no abdominal pain, bladder incontinence, bowel incontinence, chest pain, dysuria, fever, headaches, leg pain, paresis, paresthesias, pelvic pain, perianal numbness, weakness or weight loss. (Left hand numbness. )  ? ?Chief Complaint  ?Patient presents with  ?? Anxiety  ?  Was started on Celexa 10mg  the in 2 weeks take 20 mg on May 31, 2021, too the 10 mg for 2 weeks then stopped taking shortly after starting the 20mg  dose due to no energy, no motivation, stomach issues, not wanting to get out of bed, and starting to feel depressed   ? ? ?Relevant past medical, surgical, family and social history reviewed and updated as indicated. Interim medical history since our last visit reviewed. ?Allergies and medications reviewed and updated. ? ?Review of Systems  ?Constitutional:  Negative for fever, irritability and weight loss.  ?Cardiovascular:  Negative for chest pain and palpitations.  ?Gastrointestinal:  Negative for abdominal pain, anal bleeding, blood in stool and bowel incontinence.  ?Genitourinary:  Negative for bladder incontinence, dysuria and pelvic pain.  ?Musculoskeletal:  Positive for back pain. Negative for arthralgias and gait problem.  ?Skin:  Negative for color change and pallor.  ?Neurological:  Positive for tingling and numbness. Negative for weakness, headaches and paresthesias.  ?Psychiatric/Behavioral:  Negative for behavioral problems, confusion, decreased concentration, dysphoric mood and hallucinations. The patient is nervous/anxious. The patient is not hyperactive.   ? ?Per HPI unless specifically indicated above ? ?   ?Objective:  ?  ?BP 123/83   Pulse 94   Temp 98.3 ?F (36.8 ?C) (Oral)   Ht 5\' 10"  (1.778 m)   Wt 250 lb 3.2 oz (113.5 kg)   SpO2 98%   BMI 35.90 kg/m?   ?Wt Readings from Last 3 Encounters:  ?07/26/21 250 lb 3.2 oz (113.5 kg)  ?06/17/21 239 lb (108.4 kg)  ?04/22/21 242 lb (109.8 kg)  ?  ?Physical Exam ?Vitals and nursing note reviewed.  ?Constitutional:   ?  General: He is not in acute distress. ?   Appearance: Normal appearance. He is not ill-appearing or diaphoretic.  ?HENT:  ?   Head: Normocephalic and atraumatic.  ?   Right Ear: Tympanic membrane and external ear normal. There is no impacted cerumen.  ?   Left Ear: External ear normal.  ?   Nose: No congestion or rhinorrhea.  ?   Mouth/Throat:  ?   Pharynx: No oropharyngeal exudate or posterior oropharyngeal erythema.  ?Eyes:  ?   Conjunctiva/sclera: Conjunctivae normal.  ?   Pupils: Pupils are equal, round, and reactive to light.  ?Cardiovascular:   ?   Rate and Rhythm: Normal rate and regular rhythm.  ?   Heart sounds: No murmur heard. ?  No friction rub. No gallop.  ?Pulmonary:  ?   Effort: No respiratory distress.  ?   Breath sounds: No stridor. No wheezing or rhonchi.  ?Chest:  ?   Chest wall: No tenderness.  ?Musculoskeletal:     ?   General: No swelling.  ?   Cervical back: Normal range of motion and neck supple. No rigidity or tenderness.  ?   Left lower leg: No edema.  ?Skin: ?   General: Skin is warm and dry.  ?Neurological:  ?   Mental Status: He is alert.  ? ? ?Results for orders placed or performed in visit on 04/27/21  ?ECHOCARDIOGRAM COMPLETE  ?Result Value Ref Range  ? AR max vel 3.27 cm2  ? AV Peak grad 9.7 mmHg  ? Ao pk vel 1.56 m/s  ? S' Lateral 4.10 cm  ? Area-P 1/2 2.92 cm2  ? AV Area VTI 2.94 cm2  ? AV Mean grad 5.0 mmHg  ? Single Plane A4C EF 52.8 %  ? Single Plane A2C EF 52.7 %  ? Calc EF 52.7 %  ? AV Area mean vel 3.22 cm2  ? ?   ? ? ?Current Outpatient Medications:  ??  acetaminophen (TYLENOL) 500 MG tablet, Take 500-1,000 mg by mouth daily., Disp: , Rfl:  ??  Omega-3 Fatty Acids (FISH OIL) 1000 MG CAPS, Take by mouth., Disp: , Rfl:  ??  OVER THE COUNTER MEDICATION, Take 2 Pieces by mouth daily. Elderberry Vitamin C zinc gummies, Disp: , Rfl:   ? ? ?Assessment & Plan:  ?thyroid nodules.  ?US thyroid : ho thyroid nodules.  ?Enlarged multinodular thyroid gland. ?Nodules labeled 1 and 3 meet criteria for follow-up ultrasound in ?1 year. ? ?2. Anxiety celexa didn't help ?Took otc meds ?Will refer to psych for such  ? ?3. 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 Needs shots. ?Has had CT c spine : c4-c5 levels per scans in 2022.  ?? Needs injections of such. ?Problem List Items Addressed This Visit   ? ?  ? Other  ? Anxiety - Primary  ? Relevant Orders  ? Ambulatory referral to Psychiatry  ? Chronic thoracic back pain  ? Relevant Orders  ? Ambulatory referral to Orthopedics  ?  ? ?Orders  Placed This Encounter  ?Procedures  ?? Ambulatory referral to Orthopedics  ?? Ambulatory referral to Psychiatry  ?  ? ?No orders of the defined types were placed in this encounter. ?  ? ?Follow up plan: ?Return in about 3 months (around 10/25/2021). ? ? ?

## 2021-08-05 ENCOUNTER — Encounter: Payer: Self-pay | Admitting: Internal Medicine

## 2021-08-12 ENCOUNTER — Encounter: Payer: Self-pay | Admitting: Unknown Physician Specialty

## 2021-08-12 DIAGNOSIS — M549 Dorsalgia, unspecified: Secondary | ICD-10-CM

## 2021-08-12 NOTE — Telephone Encounter (Signed)
New referral place for [facility] per patient reqest.  ?

## 2021-08-12 NOTE — Telephone Encounter (Signed)
Ok thnx

## 2021-10-26 ENCOUNTER — Ambulatory Visit: Payer: Self-pay | Admitting: Unknown Physician Specialty

## 2021-11-10 ENCOUNTER — Encounter: Payer: Self-pay | Admitting: Family

## 2021-11-15 IMAGING — CT CT RENAL STONE PROTOCOL
2 of 4 series · 17 of 46 positions shown, 19 images · non-contrast
Comparison: None.

CLINICAL DATA: Bilateral flank pain

EXAM:
CT ABDOMEN AND PELVIS WITHOUT CONTRAST
TECHNIQUE: Multidetector CT imaging of the abdomen and pelvis was performed
following the standard protocol without IV contrast.

[Series 2: stone full standard · axial · 0.93mm/px · z∈[-593,-133]mm · 14 of 100 slices shown, 16 images]
[im 4/100  soft-tissue]
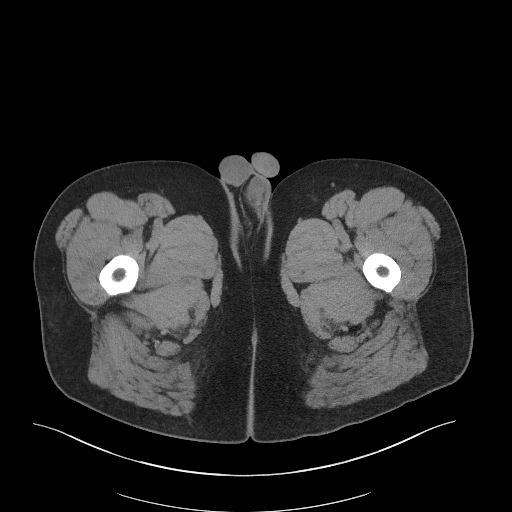
[im 4/100  bone]
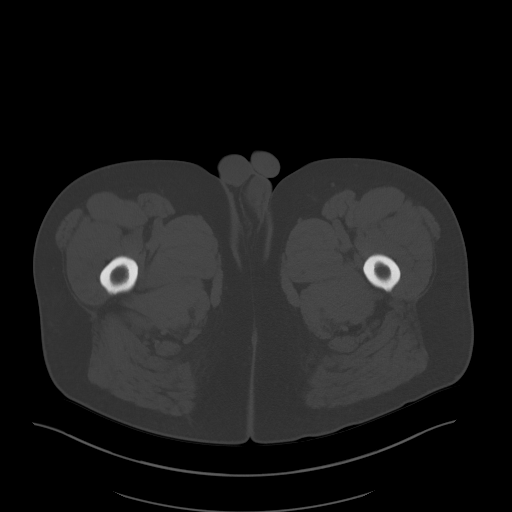
[im 12/100  soft-tissue]
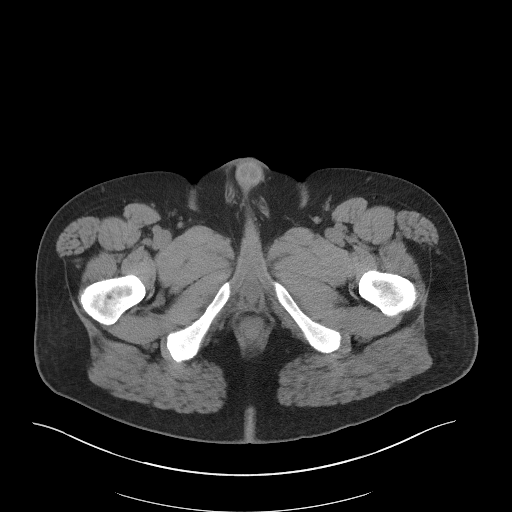
[im 20/100  soft-tissue]
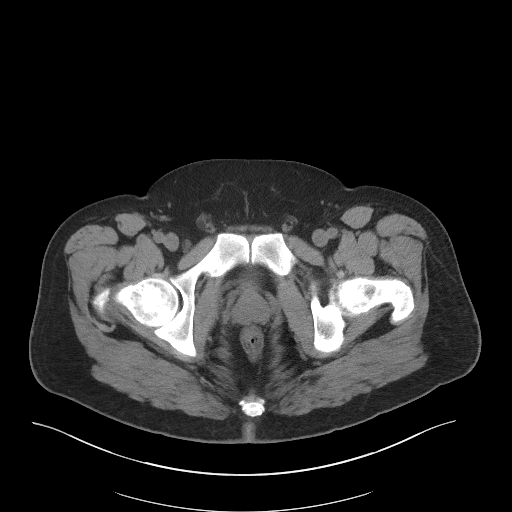
[im 27/100  soft-tissue]
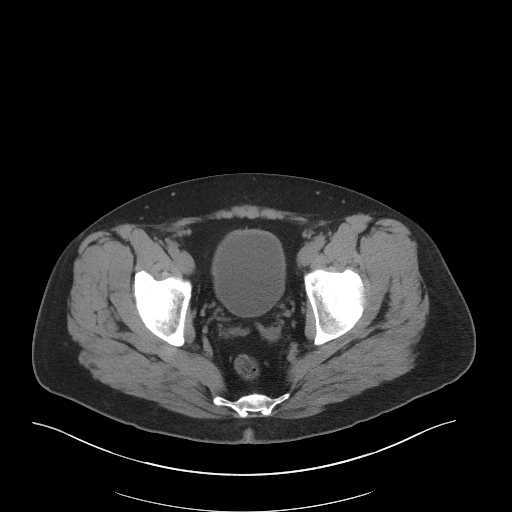
[im 35/100  soft-tissue]
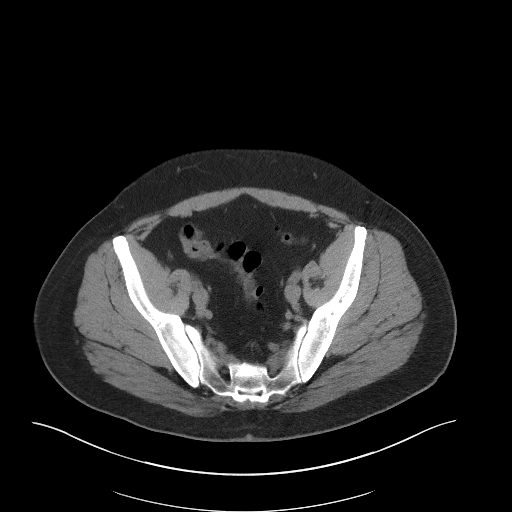
[im 39/100  soft-tissue]
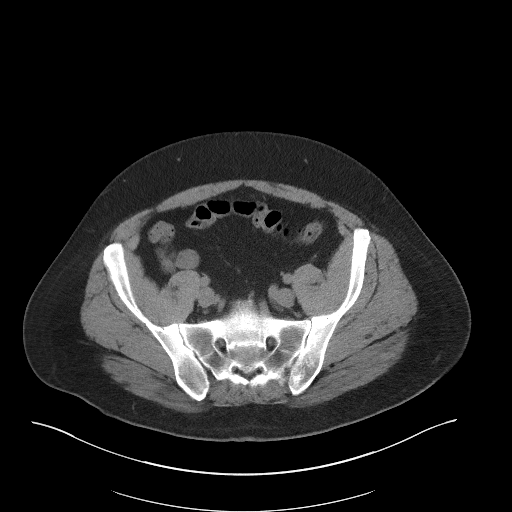
[im 46/100  soft-tissue]
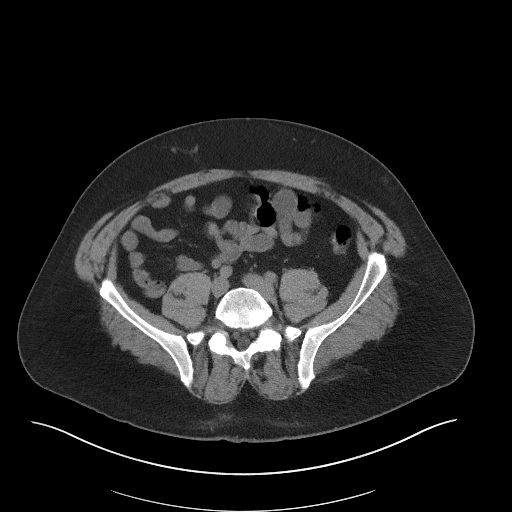
[im 54/100  soft-tissue]
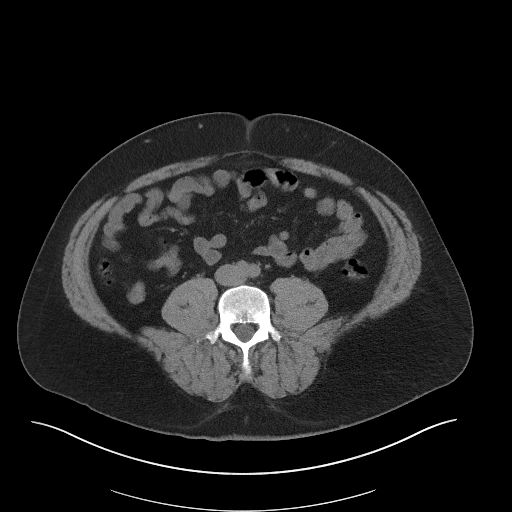
[im 61/100  soft-tissue]
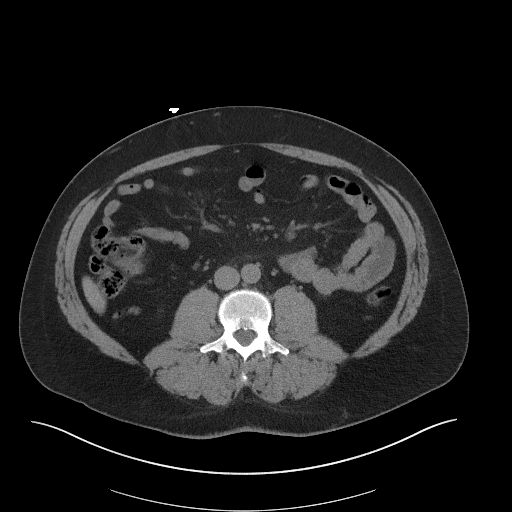
[im 61/100  bone]
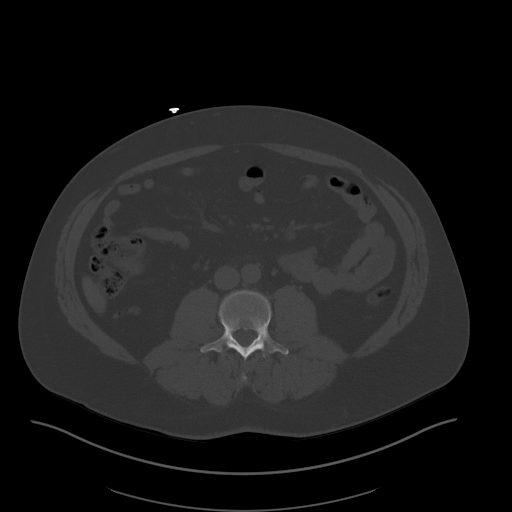
[im 65/100  soft-tissue]
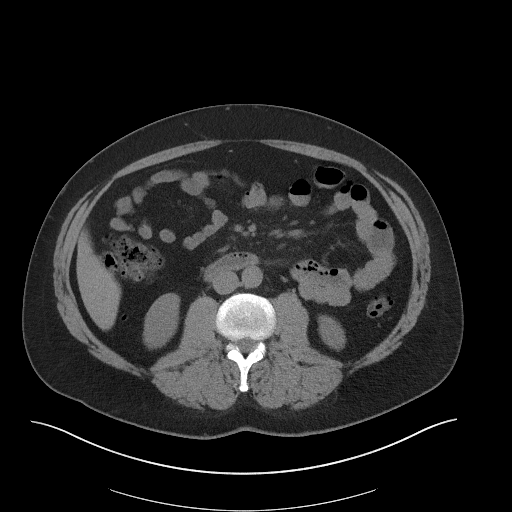
[im 73/100  soft-tissue]
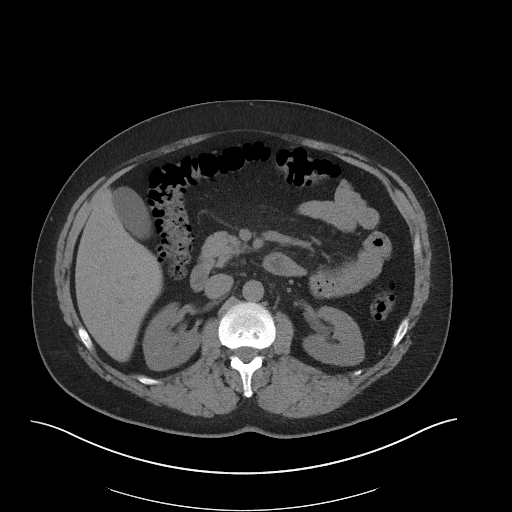
[im 80/100  soft-tissue]
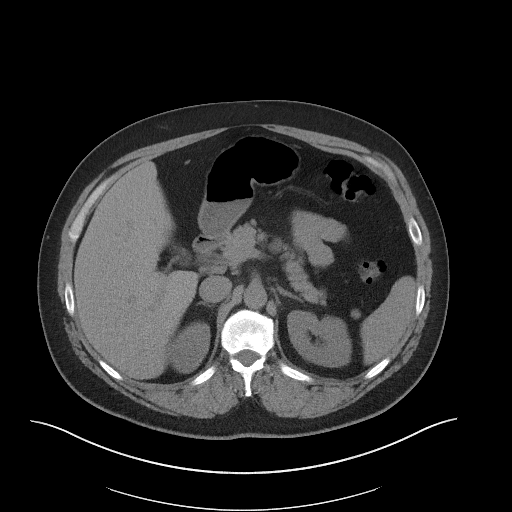
[im 88/100  soft-tissue]
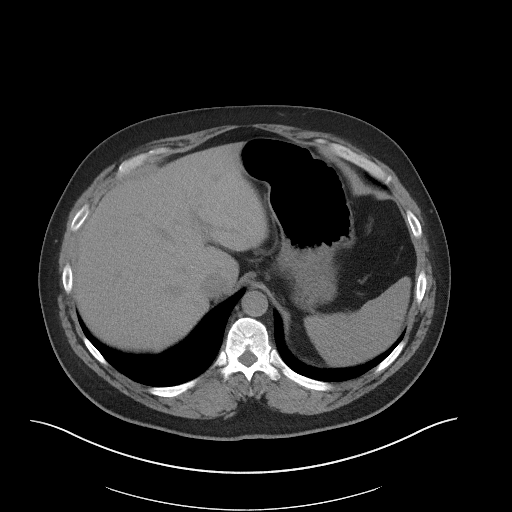
[im 96/100  soft-tissue]
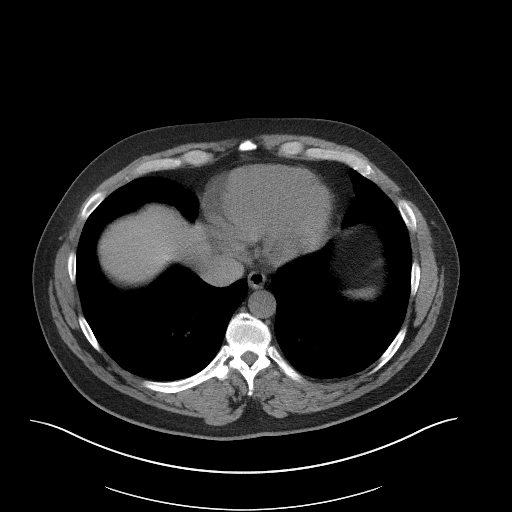

[Series 5: coronal · coronal · 0.84mm/px · 3 of 148 slices shown]
[im 50/148  soft-tissue]
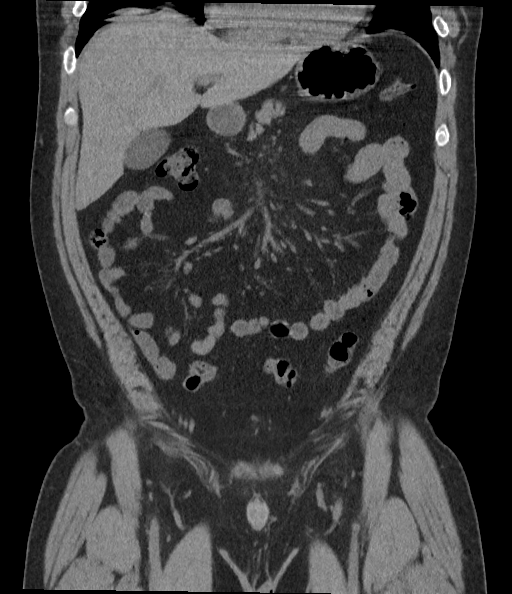
[im 66/148  soft-tissue]
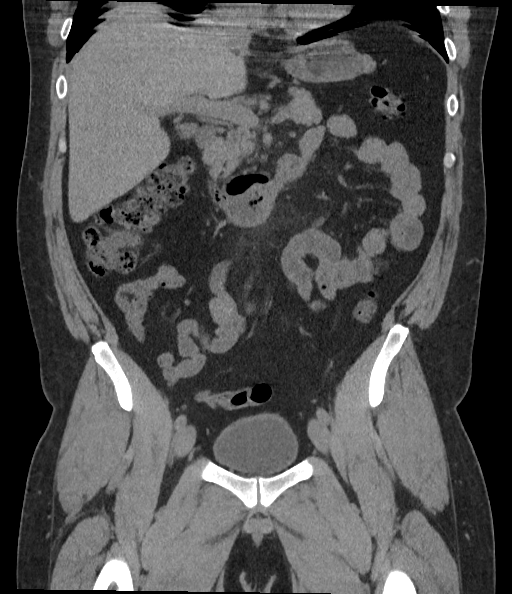
[im 82/148  soft-tissue]
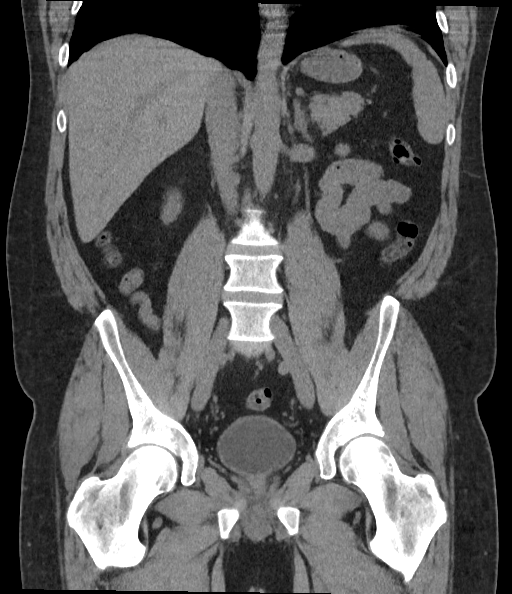

[17 of 46 positions shown; findings below may reference images not displayed]

FINDINGS: Lower chest: No acute abnormality.

Hepatobiliary: No focal liver abnormality is seen. No gallstones,
gallbladder wall thickening, or biliary dilatation.

Pancreas: Unremarkable. No pancreatic ductal dilatation or
surrounding inflammatory changes.

Spleen: Normal in size without focal abnormality.

Adrenals/Urinary Tract: Adrenal glands are unremarkable. Kidneys
show no hydronephrosis. Exophytic low-density lesion off the upper
pole left kidney is probably a cyst. Bladder unremarkable

Stomach/Bowel: Stomach is within normal limits. Appendix appears
normal. No evidence of bowel wall thickening, distention, or
inflammatory changes.

Vascular/Lymphatic: No significant vascular findings are present. No
enlarged abdominal or pelvic lymph nodes.

Reproductive: Prostate is unremarkable.

Other: No abdominal wall hernia or abnormality. No abdominopelvic
ascites.

Musculoskeletal: No acute or significant osseous findings.
IMPRESSION: No CT evidence for acute intra-abdominal or intrapelvic abnormality.

## 2021-11-23 ENCOUNTER — Ambulatory Visit: Payer: Self-pay | Admitting: Unknown Physician Specialty

## 2021-12-21 ENCOUNTER — Ambulatory Visit (INDEPENDENT_AMBULATORY_CARE_PROVIDER_SITE_OTHER): Payer: Medicaid Other | Admitting: Unknown Physician Specialty

## 2021-12-21 ENCOUNTER — Encounter: Payer: Self-pay | Admitting: Unknown Physician Specialty

## 2021-12-21 VITALS — BP 103/63 | HR 50 | Temp 98.6°F | Ht 70.0 in | Wt 244.3 lb

## 2021-12-21 DIAGNOSIS — G8929 Other chronic pain: Secondary | ICD-10-CM

## 2021-12-21 DIAGNOSIS — F419 Anxiety disorder, unspecified: Secondary | ICD-10-CM

## 2021-12-21 DIAGNOSIS — M545 Low back pain, unspecified: Secondary | ICD-10-CM

## 2021-12-21 DIAGNOSIS — R82998 Other abnormal findings in urine: Secondary | ICD-10-CM

## 2021-12-21 LAB — URINALYSIS
Bilirubin, UA: NEGATIVE
Glucose, UA: NEGATIVE
Ketones, UA: NEGATIVE
Leukocytes,UA: NEGATIVE
Nitrite, UA: NEGATIVE
Protein,UA: NEGATIVE
RBC, UA: NEGATIVE
Specific Gravity, UA: >1.03 — ABNORMAL HIGH (ref 1.005–1.030)
Urobilinogen, Ur: 0.2 mg/dL (ref 0.2–1.0)
pH, UA: 5.5 (ref 5.0–7.5)

## 2021-12-21 NOTE — Patient Instructions (Signed)
Don't Panic

## 2021-12-21 NOTE — Assessment & Plan Note (Signed)
Has not done well with hydroxyzine prn or Citalopram daily.  I recommended some reading for him.

## 2021-12-21 NOTE — Progress Notes (Signed)
BP 103/63   Pulse (!) 50   Temp 98.6 F (37 C) (Oral)   Ht 5\' 10"  (1.778 m)   Wt 244 lb 4.8 oz (110.8 kg)   SpO2 99%   BMI 35.05 kg/m    Subjective:    Patient ID: Brett Robinson, male    DOB: [birth year], 36 y.o.   MRN: [id number]  HPI: Brett Robinson is a 36 y.o. male  Chief Complaint  Patient presents with   Anxiety   Back Pain    For the past 7 months   Back pain - Pt has been under the care of Orthopedics.  States they transferred to neurology.  States going to chiropractor once a week.  Concerned he has foamy urine.  Has not been to a physical therapist.  Describes pain in his lower back that radiates to mid back.  No radiation down the legs.  Has taken Flexeril in the past. Tylenol doesnlt help.  Ibuprofen messed up his GI system.  Marijuana does seem to help with sleep and anxiety  generalized anxiety disorder - Had been on Celexa which made him sleepy      12/21/2021    8:36 AM 07/26/2021    1:10 PM 05/31/2021   11:24 AM 04/22/2021    8:33 AM 04/08/2021   10:22 AM  Depression screen PHQ 2/9  Decreased Interest 1 2 0 3 1  Down, Depressed, Hopeless 0 0 0 3 0  PHQ - 2 Score 1 2 0 6 1  Altered sleeping 2 3 2 2 3   Tired, decreased energy 3 3 1 2 2   Change in appetite 0 2 0 0 1  Feeling bad or failure about yourself  0 0 0 0 0  Trouble concentrating 3 3 2 2 1   Moving slowly or fidgety/restless 0 1 1 1 1   Suicidal thoughts 0 0 0 0 0  PHQ-9 Score 9 14 6 13 9   Difficult doing work/chores Somewhat difficult Very difficult  Very difficult      Relevant past medical, surgical, family and social history reviewed and updated as indicated. Interim medical history since our last visit reviewed. Allergies and medications reviewed and updated.  Review of Systems  Per HPI unless specifically indicated above     Objective:    BP 103/63   Pulse (!) 50   Temp 98.6 F (37 C) (Oral)   Ht 5\' 10"  (1.778 m)   Wt 244 lb 4.8 oz (110.8 kg)   SpO2 99%   BMI 35.05  kg/m   Wt Readings from Last 3 Encounters:  12/21/21 244 lb 4.8 oz (110.8 kg)  07/26/21 250 lb 3.2 oz (113.5 kg)  06/17/21 239 lb (108.4 kg)    Physical Exam Constitutional:      General: He is not in acute distress.    Appearance: Normal appearance. He is well-developed.  HENT:     Head: Normocephalic and atraumatic.  Eyes:     General: Lids are normal. No scleral icterus.       Right eye: No discharge.        Left eye: No discharge.     Conjunctiva/sclera: Conjunctivae normal.  Neck:     Vascular: No carotid bruit or JVD.  Cardiovascular:     Heart sounds: Normal heart sounds.  Pulmonary:     Effort: No respiratory distress.  Abdominal:     Palpations: There is no hepatomegaly or splenomegaly.  Musculoskeletal:        General: Normal  range of motion.     Cervical back: Normal range of motion and neck supple.  Skin:    General: Skin is warm and dry.     Coloration: Skin is not pale.     Findings: No rash.  Neurological:     Mental Status: He is alert and oriented to person, place, and time.  Psychiatric:        Behavior: Behavior normal.        Thought Content: Thought content normal.        Judgment: Judgment normal.     Results for orders placed or performed in visit on 12/21/21  Urinalysis  Result Value Ref Range   Specific Gravity, UA >1.030 (H) 1.005 - 1.030   pH, UA 5.5 5.0 - 7.5   Color, UA Yellow Yellow   Appearance Ur Clear Clear   Leukocytes,UA Negative Negative   Protein,UA Negative Negative/Trace   Glucose, UA Negative Negative   Ketones, UA Negative Negative   RBC, UA Negative Negative   Bilirubin, UA Negative Negative   Urobilinogen, Ur 0.2 0.2 - 1.0 mg/dL   Nitrite, UA Negative Negative      Assessment & Plan:   Problem List Items Addressed This Visit       Unprioritized   Anxiety    Has not done well with hydroxyzine prn or Citalopram daily.  I recommended some reading for him.        Chronic back pain    Ongoing for some time.   Negative urine.  Pt has been under the care of [employer] and chiropractor. Stretching seems to help.  Refer to PT for further management and exercises.  Pt does not do well with medications and will avoid for now      Relevant Orders   Ambulatory referral to Physical Therapy   Other Visit Diagnoses     Foamy urine    -  Primary   Subjective.  No protein noted in urine.     Relevant Orders   Urinalysis (Completed)        Follow up plan: Return in about 3 months (around 03/22/2022) for physical.

## 2021-12-21 NOTE — Assessment & Plan Note (Signed)
Ongoing for some time.  Negative urine.  Pt has been under the care of [employer] and chiropractor. Stretching seems to help.  Refer to PT for further management and exercises.  Pt does not do well with medications and will avoid for now

## 2021-12-31 ENCOUNTER — Encounter (HOSPITAL_COMMUNITY): Payer: Self-pay

## 2021-12-31 ENCOUNTER — Emergency Department (HOSPITAL_COMMUNITY): Payer: No Typology Code available for payment source

## 2021-12-31 ENCOUNTER — Other Ambulatory Visit: Payer: Self-pay

## 2021-12-31 ENCOUNTER — Emergency Department (HOSPITAL_COMMUNITY)
Admission: EM | Admit: 2021-12-31 | Discharge: 2021-12-31 | Disposition: A | Discharge status: Home / Self Care | Payer: No Typology Code available for payment source | Attending: Emergency Medicine | Admitting: Emergency Medicine

## 2021-12-31 DIAGNOSIS — S30811A Abrasion of abdominal wall, initial encounter: Secondary | ICD-10-CM | POA: Diagnosis not present

## 2021-12-31 DIAGNOSIS — S70312A Abrasion, left thigh, initial encounter: Secondary | ICD-10-CM | POA: Diagnosis not present

## 2021-12-31 DIAGNOSIS — S80211A Abrasion, right knee, initial encounter: Secondary | ICD-10-CM | POA: Diagnosis not present

## 2021-12-31 DIAGNOSIS — S4292XA Fracture of left shoulder girdle, part unspecified, initial encounter for closed fracture: Secondary | ICD-10-CM

## 2021-12-31 DIAGNOSIS — S42292A Other displaced fracture of upper end of left humerus, initial encounter for closed fracture: Secondary | ICD-10-CM | POA: Diagnosis not present

## 2021-12-31 DIAGNOSIS — S80212A Abrasion, left knee, initial encounter: Secondary | ICD-10-CM | POA: Insufficient documentation

## 2021-12-31 DIAGNOSIS — R7989 Other specified abnormal findings of blood chemistry: Secondary | ICD-10-CM | POA: Diagnosis not present

## 2021-12-31 DIAGNOSIS — Y92410 Unspecified street and highway as the place of occurrence of the external cause: Secondary | ICD-10-CM | POA: Diagnosis not present

## 2021-12-31 DIAGNOSIS — S90512A Abrasion, left ankle, initial encounter: Secondary | ICD-10-CM | POA: Diagnosis not present

## 2021-12-31 DIAGNOSIS — S4992XA Unspecified injury of left shoulder and upper arm, initial encounter: Secondary | ICD-10-CM | POA: Diagnosis present

## 2021-12-31 DIAGNOSIS — D72829 Elevated white blood cell count, unspecified: Secondary | ICD-10-CM | POA: Insufficient documentation

## 2021-12-31 DIAGNOSIS — T148XXA Other injury of unspecified body region, initial encounter: Secondary | ICD-10-CM

## 2021-12-31 LAB — COMPREHENSIVE METABOLIC PANEL
ALT: 25 U/L (ref 0–44)
AST: 28 U/L (ref 15–41)
Albumin: 4.5 g/dL (ref 3.5–5.0)
Alkaline Phosphatase: 69 U/L (ref 38–126)
Anion gap: 9 (ref 5–15)
BUN: 18 mg/dL (ref 6–20)
CO2: 25 mmol/L (ref 22–32)
Calcium: 9.2 mg/dL (ref 8.9–10.3)
Chloride: 100 mmol/L (ref 98–111)
Creatinine, Ser: 0.93 mg/dL (ref 0.61–1.24)
GFR, Estimated: >60 mL/min (ref >60)
Glucose, Bld: 142 mg/dL — ABNORMAL HIGH (ref 70–99)
Potassium: 4.2 mmol/L (ref 3.5–5.1)
Sodium: 134 mmol/L — ABNORMAL LOW (ref 135–145)
Total Bilirubin: 0.8 mg/dL (ref 0.3–1.2)
Total Protein: 7.2 g/dL (ref 6.5–8.1)

## 2021-12-31 LAB — CBC
HCT: 43.3 % (ref 39.0–52.0)
Hemoglobin: 15.2 g/dL (ref 13.0–17.0)
MCH: 30.4 pg (ref 26.0–34.0)
MCHC: 35.1 g/dL (ref 30.0–36.0)
MCV: 86.6 fL (ref 80.0–100.0)
Platelets: 283 10*3/uL (ref 150–400)
RBC: 5 MIL/uL (ref 4.22–5.81)
RDW: 12.4 % (ref 11.5–15.5)
WBC: 21.3 10*3/uL — ABNORMAL HIGH (ref 4.0–10.5)
nRBC: 0 % (ref 0.0–0.2)

## 2021-12-31 LAB — I-STAT CHEM 8, ED
BUN: 22 mg/dL — ABNORMAL HIGH (ref 6–20)
Calcium, Ion: 1.17 mmol/L (ref 1.15–1.40)
Chloride: 103 mmol/L (ref 98–111)
Creatinine, Ser: 0.9 mg/dL (ref 0.61–1.24)
Glucose, Bld: 140 mg/dL — ABNORMAL HIGH (ref 70–99)
HCT: 46 % (ref 39.0–52.0)
Hemoglobin: 15.6 g/dL (ref 13.0–17.0)
Potassium: 4.3 mmol/L (ref 3.5–5.1)
Sodium: 138 mmol/L (ref 135–145)
TCO2: 26 mmol/L (ref 22–32)

## 2021-12-31 LAB — LACTIC ACID, PLASMA: Lactic Acid, Venous: 2.4 mmol/L (ref 0.5–1.9)

## 2021-12-31 LAB — ETHANOL: Alcohol, Ethyl (B): <10 mg/dL (ref <10)

## 2021-12-31 MED ORDER — PROPOFOL 10 MG/ML IV BOLUS
INTRAVENOUS | Status: AC | PRN
  Administered 2021-12-31: 50 mg via INTRAVENOUS
  Administered 2021-12-31: 30 mg via INTRAVENOUS
  Administered 2021-12-31: 40 mg via INTRAVENOUS
  Administered 2021-12-31: 20 mg via INTRAVENOUS
  Administered 2021-12-31: 40 mg via INTRAVENOUS
  Administered 2021-12-31: 30 mg via INTRAVENOUS
  Administered 2021-12-31 (×2): 20 mg via INTRAVENOUS

## 2021-12-31 MED ORDER — SODIUM CHLORIDE 0.9 % IV BOLUS
125.0000 mL | Freq: Once | INTRAVENOUS | Status: AC
  Administered 2021-12-31: 125 mL via INTRAVENOUS

## 2021-12-31 MED ORDER — ONDANSETRON HCL 4 MG/2ML IJ SOLN
4.0000 mg | Freq: Once | INTRAMUSCULAR | Status: AC
  Administered 2021-12-31: 4 mg via INTRAVENOUS
  Filled 2021-12-31: qty 2

## 2021-12-31 MED ORDER — OXYCODONE-ACETAMINOPHEN 5-325 MG PO TABS: 1.0000 | ORAL_TABLET | Freq: Four times a day (QID) | ORAL | 0 refills | Status: DC | PRN

## 2021-12-31 MED ORDER — IOHEXOL 350 MG/ML SOLN
75.0000 mL | Freq: Once | INTRAVENOUS | Status: AC | PRN
  Administered 2021-12-31: 75 mL via INTRAVENOUS

## 2021-12-31 MED ORDER — HYDROMORPHONE HCL 1 MG/ML IJ SOLN
1.0000 mg | Freq: Once | INTRAMUSCULAR | Status: AC
  Administered 2021-12-31: 1 mg via INTRAVENOUS
  Filled 2021-12-31: qty 1

## 2021-12-31 MED ORDER — PROPOFOL 10 MG/ML IV BOLUS
INTRAVENOUS | Status: AC
  Filled 2021-12-31: qty 20

## 2021-12-31 MED ORDER — PROPOFOL 10 MG/ML IV BOLUS
0.5000 mg/kg | Freq: Once | INTRAVENOUS | Status: DC
  Filled 2021-12-31: qty 20

## 2021-12-31 NOTE — ED Notes (Signed)
Pt to CT on full cardiac monitor, via stretcher, with Thurmond Butts, RN and Leafy Ro, Therapist, sports.
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
## 2021-12-31 NOTE — ED Notes (Signed)
C Collar removed per provider 
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
## 2021-12-31 NOTE — ED Notes (Signed)
Ortho tech at bedside, shoulder immobilizer placed. Tolerated well, family also present at bedside.

## 2021-12-31 NOTE — Progress Notes (Signed)
Orthopedic Tech Progress Note Patient Details:  Brett Robinson September 13, 1985 [id number]  Ortho Devices Type of Ortho Device: Shoulder immobilizer Ortho Device/Splint Location: LUE Ortho Device/Splint Interventions: Ordered, Application, Adjustment   Post Interventions Patient Tolerated: Well Instructions Provided: Adjustment of device, Care of device  Brett Robinson 12/31/2021, 1:06 PM

## 2021-12-31 NOTE — ED Notes (Signed)
X-ray at bedside
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
## 2021-12-31 NOTE — ED Notes (Signed)
Trauma Event Note  Event Summary:  Assisted Primary RN and EDP with conscious sedation and reduction of left shoulder dislocation. Assisted with sedation medications.    Last imported Vital Signs BP 118/83   Pulse (!) 104   Temp 98.1 F (36.7 C) (Oral)   Resp 10   Ht 5\' 11"  (1.803 m)   Wt 235 lb (106.6 kg)   SpO2 100%   BMI 32.78 kg/m   Trending CBC Recent Labs    12/31/21 1020 12/31/21 1021  WBC 21.3*  --   HGB 15.2 15.6  HCT 43.3 46.0  PLT 283  --     Trending Coag's No results for input(s): "APTT", "INR" in the last 72 hours.  Trending BMET Recent Labs    12/31/21 1020 12/31/21 1021  NA 134* 138  K 4.2 4.3  CL 100 103  CO2 25  --   BUN 18 22*  CREATININE 0.93 0.90  GLUCOSE 142* 140*      [place]  Trauma Response RN  Please call TRN at [phone] for further assistance.

## 2021-12-31 NOTE — ED Notes (Signed)
Pt states he is ready to go home, AOX4, no s/s of distress, family present at bedside, will continue to monitor.

## 2022-03-07 ENCOUNTER — Other Ambulatory Visit (HOSPITAL_COMMUNITY): Payer: Self-pay | Admitting: Orthopedic Surgery

## 2022-03-07 DIAGNOSIS — S4292XA Fracture of left shoulder girdle, part unspecified, initial encounter for closed fracture: Secondary | ICD-10-CM

## 2022-03-09 ENCOUNTER — Ambulatory Visit (HOSPITAL_COMMUNITY)
Admission: RE | Admit: 2022-03-09 | Discharge: 2022-03-09 | Disposition: A | Discharge status: Home / Self Care | Payer: Self-pay | Source: Ambulatory Visit | Attending: Orthopedic Surgery | Admitting: Orthopedic Surgery

## 2022-03-09 DIAGNOSIS — Y939 Activity, unspecified: Secondary | ICD-10-CM | POA: Insufficient documentation

## 2022-03-09 DIAGNOSIS — Y929 Unspecified place or not applicable: Secondary | ICD-10-CM | POA: Insufficient documentation

## 2022-03-09 DIAGNOSIS — S4292XA Fracture of left shoulder girdle, part unspecified, initial encounter for closed fracture: Secondary | ICD-10-CM | POA: Insufficient documentation

## 2022-03-09 DIAGNOSIS — M6250 Muscle wasting and atrophy, not elsewhere classified, unspecified site: Secondary | ICD-10-CM | POA: Insufficient documentation

## 2022-03-09 DIAGNOSIS — M75122 Complete rotator cuff tear or rupture of left shoulder, not specified as traumatic: Secondary | ICD-10-CM | POA: Insufficient documentation

## 2022-03-09 DIAGNOSIS — M19012 Primary osteoarthritis, left shoulder: Secondary | ICD-10-CM | POA: Insufficient documentation

## 2022-03-09 DIAGNOSIS — X58XXXA Exposure to other specified factors, initial encounter: Secondary | ICD-10-CM | POA: Insufficient documentation

## 2022-03-09 MED ORDER — SODIUM CHLORIDE (PF) 0.9 % IJ SOLN
10.0000 mL | Freq: Once | INTRAMUSCULAR | Status: AC
  Administered 2022-03-09: 10 mL

## 2022-03-09 MED ORDER — GADOBUTROL 1 MMOL/ML IV SOLN
0.0500 mL | Freq: Once | INTRAVENOUS | Status: AC | PRN
  Administered 2022-03-09: 0.05 mL

## 2022-03-09 MED ORDER — LIDOCAINE HCL (PF) 1 % IJ SOLN
2.0000 mL | Freq: Once | INTRAMUSCULAR | Status: AC
  Administered 2022-03-09: 2 mL via INTRADERMAL

## 2022-03-09 MED ORDER — IOHEXOL 180 MG/ML  SOLN
10.0000 mL | Freq: Once | INTRAMUSCULAR | Status: AC | PRN
  Administered 2022-03-09: 10 mL via INTRA_ARTICULAR

## 2022-03-22 ENCOUNTER — Ambulatory Visit: Payer: Medicaid Other | Admitting: Physician Assistant

## 2022-04-18 ENCOUNTER — Other Ambulatory Visit: Payer: Self-pay | Admitting: Orthopedic Surgery

## 2022-04-19 ENCOUNTER — Other Ambulatory Visit: Payer: Self-pay

## 2022-04-19 ENCOUNTER — Encounter
Admission: RE | Admit: 2022-04-19 | Discharge: 2022-04-19 | Disposition: A | Discharge status: Home / Self Care | Payer: Medicaid Other | Source: Ambulatory Visit | Attending: Orthopedic Surgery | Admitting: Orthopedic Surgery

## 2022-04-19 HISTORY — DX: Headache, unspecified: R51.9

## 2022-04-19 NOTE — Patient Instructions (Addendum)
Your procedure is scheduled on: 04/26/22 - Tuesday Report to the Registration Desk on the 1st floor of the [facility]. To find out your arrival time, please call [phone] between 1PM - 3PM on: 04/25/22 - Monday If your arrival time is 6:00 am, do not arrive prior to that time as the [place] entrance doors do not open until 6:00 am.  REMEMBER: Instructions that are not followed completely may result in serious medical risk, up to and including death; or upon the discretion of your surgeon and anesthesiologist your surgery may need to be rescheduled.  Do not eat food or drink any liquids after midnight the night before surgery.  No gum chewing, lozengers or hard candies.   TAKE THESE MEDICATIONS THE MORNING OF SURGERY WITH A SIP OF WATER: NONE   One week prior to surgery: Stop Anti-inflammatories (NSAIDS) such as Advil, Aleve, Ibuprofen, Motrin, Naproxen, Naprosyn and Aspirin based products such as Excedrin, Goodys Powder, BC Powder.  Stop ANY OVER THE COUNTER supplements until after surgery.  You may take Tylenol if needed for pain up until the day of surgery.  No Alcohol for 24 hours before or after surgery.  No Smoking including e-cigarettes for 24 hours prior to surgery.  No chewable tobacco products for at least 6 hours prior to surgery.  No nicotine patches on the day of surgery.  Do not use any "recreational" drugs for at least a week prior to your surgery.  Please be advised that the combination of cocaine and anesthesia may have negative outcomes, up to and including death. If you test positive for cocaine, your surgery will be cancelled.  On the morning of surgery brush your teeth with toothpaste and water, you may rinse your mouth with mouthwash if you wish. Do not swallow any toothpaste or mouthwash.  Use CHG Soap or wipes as directed on instruction sheet.  Do not wear jewelry, make-up, hairpins, clips or nail polish.  Do not wear lotions, powders, or  perfumes.   Do not shave body from the neck down 48 hours prior to surgery just in case you cut yourself which could leave a site for infection.  Also, freshly shaved skin may become irritated if using the CHG soap.  Contact lenses, hearing aids and dentures may not be worn into surgery.  Do not bring valuables to the hospital. [facility] is not responsible for any missing/lost belongings or valuables.   Notify your doctor if there is any change in your medical condition (cold, fever, infection).  Wear comfortable clothing (specific to your surgery type) to the hospital.  After surgery, you can help prevent lung complications by doing breathing exercises.  Take deep breaths and cough every 1-2 hours. Your doctor may order a device called an Incentive Spirometer to help you take deep breaths. When coughing or sneezing, hold a pillow firmly against your incision with both hands. This is called "splinting." Doing this helps protect your incision. It also decreases belly discomfort.  If you are being admitted to the hospital overnight, leave your suitcase in the car. After surgery it may be brought to your room.  If you are being discharged the day of surgery, you will not be allowed to drive home. You will need a responsible adult (18 years or older) to drive you home and stay with you that night.   If you are taking public transportation, you will need to have a responsible adult (18 years or older) with you. Please confirm with  your physician that it is acceptable to use public transportation.   Please call the [place] Dept. at [phone] if you have any questions about these instructions.  Surgery Visitation Policy:  Patients undergoing a surgery or procedure may have two family members or support persons with them as long as the person is not COVID-19 positive or experiencing its symptoms.   Inpatient Visitation:    Visiting hours are 7 a.m. to 8 p.m. Up to  four visitors are allowed at one time in a patient room. The visitors may rotate out with other people during the day. One designated support person (adult) may remain overnight.  Due to an increase in RSV and influenza rates and associated hospitalizations, children ages 39 and under will not be able to visit patients in [facility] Masks continue to be strongly recommended.

## 2022-04-25 MED ORDER — ORAL CARE MOUTH RINSE: 15.0000 mL | Freq: Once | OROMUCOSAL | Status: AC

## 2022-04-25 MED ORDER — CHLORHEXIDINE GLUCONATE CLOTH 2 % EX PADS: 6.0000 | MEDICATED_PAD | Freq: Once | CUTANEOUS | Status: DC

## 2022-04-25 MED ORDER — ACETAMINOPHEN 500 MG PO TABS: 1000.0000 mg | ORAL_TABLET | ORAL | Status: AC

## 2022-04-25 MED ORDER — CEFAZOLIN SODIUM-DEXTROSE 2-4 GM/100ML-% IV SOLN
2.0000 g | INTRAVENOUS | Status: AC
  Administered 2022-04-26: 2 g via INTRAVENOUS

## 2022-04-25 MED ORDER — FAMOTIDINE 20 MG PO TABS: 20.0000 mg | ORAL_TABLET | Freq: Once | ORAL | Status: AC

## 2022-04-25 MED ORDER — CHLORHEXIDINE GLUCONATE 0.12 % MT SOLN: 15.0000 mL | Freq: Once | OROMUCOSAL | Status: AC

## 2022-04-25 MED ORDER — LACTATED RINGERS IV SOLN: INTRAVENOUS | Status: DC

## 2022-04-26 ENCOUNTER — Ambulatory Visit: Payer: Medicaid Other

## 2022-04-26 ENCOUNTER — Encounter
Admission: RE | Disposition: A | Discharge status: Home / Self Care | Payer: Self-pay | Source: Ambulatory Visit | Attending: Orthopedic Surgery

## 2022-04-26 ENCOUNTER — Other Ambulatory Visit: Payer: Self-pay

## 2022-04-26 ENCOUNTER — Ambulatory Visit: Payer: Medicaid Other | Admitting: Certified Registered"

## 2022-04-26 ENCOUNTER — Ambulatory Visit
Admission: RE | Admit: 2022-04-26 | Discharge: 2022-04-26 | Disposition: A | Discharge status: Home / Self Care | Payer: Medicaid Other | Source: Ambulatory Visit | Attending: Orthopedic Surgery | Admitting: Orthopedic Surgery

## 2022-04-26 ENCOUNTER — Encounter: Payer: Self-pay | Admitting: Orthopedic Surgery

## 2022-04-26 DIAGNOSIS — S46012A Strain of muscle(s) and tendon(s) of the rotator cuff of left shoulder, initial encounter: Secondary | ICD-10-CM | POA: Insufficient documentation

## 2022-04-26 DIAGNOSIS — M25812 Other specified joint disorders, left shoulder: Secondary | ICD-10-CM | POA: Diagnosis not present

## 2022-04-26 DIAGNOSIS — Z87891 Personal history of nicotine dependence: Secondary | ICD-10-CM | POA: Diagnosis not present

## 2022-04-26 HISTORY — PX: SHOULDER ARTHROSCOPY: SHX128

## 2022-04-26 HISTORY — PX: SHOULDER OPEN ROTATOR CUFF REPAIR: SHX2407

## 2022-04-26 SURGERY — ARTHROSCOPY, SHOULDER
Anesthesia: General | Site: Shoulder | Laterality: Left

## 2022-04-26 MED ORDER — PHENYLEPHRINE 80 MCG/ML (10ML) SYRINGE FOR IV PUSH (FOR BLOOD PRESSURE SUPPORT)
PREFILLED_SYRINGE | INTRAVENOUS | Status: DC | PRN
  Administered 2022-04-26: 80 ug via INTRAVENOUS
  Administered 2022-04-26 (×2): 160 ug via INTRAVENOUS
  Administered 2022-04-26: 80 ug via INTRAVENOUS
  Administered 2022-04-26: 160 ug via INTRAVENOUS

## 2022-04-26 MED ORDER — LIDOCAINE HCL (PF) 1 % IJ SOLN
INTRAMUSCULAR | Status: DC | PRN
  Administered 2022-04-26: 4 mL via SUBCUTANEOUS

## 2022-04-26 MED ORDER — EPINEPHRINE PF 1 MG/ML IJ SOLN
INTRAMUSCULAR | Status: AC
  Filled 2022-04-26: qty 4

## 2022-04-26 MED ORDER — ACETAMINOPHEN 500 MG PO TABS
ORAL_TABLET | ORAL | Status: AC
  Administered 2022-04-26: 1000 mg via ORAL
  Filled 2022-04-26: qty 2

## 2022-04-26 MED ORDER — PHENYLEPHRINE HCL-NACL 20-0.9 MG/250ML-% IV SOLN
INTRAVENOUS | Status: DC | PRN
  Administered 2022-04-26: 15 ug/min via INTRAVENOUS

## 2022-04-26 MED ORDER — BUPIVACAINE HCL (PF) 0.5 % IJ SOLN
INTRAMUSCULAR | Status: AC
  Filled 2022-04-26: qty 10

## 2022-04-26 MED ORDER — PROMETHAZINE HCL 25 MG/ML IJ SOLN: 6.2500 mg | INTRAMUSCULAR | Status: DC | PRN

## 2022-04-26 MED ORDER — FENTANYL CITRATE PF 50 MCG/ML IJ SOSY: 50.0000 ug | PREFILLED_SYRINGE | Freq: Once | INTRAMUSCULAR | Status: AC

## 2022-04-26 MED ORDER — ONDANSETRON HCL 4 MG/2ML IJ SOLN
INTRAMUSCULAR | Status: DC | PRN
  Administered 2022-04-26 (×2): 4 mg via INTRAVENOUS

## 2022-04-26 MED ORDER — LIDOCAINE HCL (PF) 1 % IJ SOLN
INTRAMUSCULAR | Status: AC
  Filled 2022-04-26: qty 5

## 2022-04-26 MED ORDER — BUPIVACAINE LIPOSOME 1.3 % IJ SUSP
INTRAMUSCULAR | Status: DC | PRN
  Administered 2022-04-26: 20 mL via PERINEURAL

## 2022-04-26 MED ORDER — BUPIVACAINE HCL (PF) 0.5 % IJ SOLN
INTRAMUSCULAR | Status: DC | PRN
  Administered 2022-04-26: 10 mL via PERINEURAL

## 2022-04-26 MED ORDER — LIDOCAINE HCL (PF) 1 % IJ SOLN
INTRAMUSCULAR | Status: AC
  Filled 2022-04-26: qty 30

## 2022-04-26 MED ORDER — GLYCOPYRROLATE 0.2 MG/ML IJ SOLN
INTRAMUSCULAR | Status: DC | PRN
  Administered 2022-04-26: .2 mg via INTRAVENOUS

## 2022-04-26 MED ORDER — EPHEDRINE SULFATE (PRESSORS) 50 MG/ML IJ SOLN
INTRAMUSCULAR | Status: DC | PRN
  Administered 2022-04-26 (×3): 5 mg via INTRAVENOUS

## 2022-04-26 MED ORDER — MIDAZOLAM HCL 2 MG/2ML IJ SOLN
INTRAMUSCULAR | Status: AC
  Administered 2022-04-26: 1 mg via INTRAVENOUS
  Filled 2022-04-26: qty 2

## 2022-04-26 MED ORDER — ONDANSETRON HCL 4 MG PO TABS: 4.0000 mg | ORAL_TABLET | Freq: Three times a day (TID) | ORAL | 0 refills | Status: AC | PRN

## 2022-04-26 MED ORDER — RINGERS IRRIGATION IR SOLN
Status: DC | PRN
  Administered 2022-04-26: 4000 mL

## 2022-04-26 MED ORDER — MIDAZOLAM HCL 2 MG/2ML IJ SOLN
INTRAMUSCULAR | Status: AC
  Filled 2022-04-26: qty 2

## 2022-04-26 MED ORDER — CEFAZOLIN SODIUM-DEXTROSE 2-4 GM/100ML-% IV SOLN
INTRAVENOUS | Status: AC
  Filled 2022-04-26: qty 100

## 2022-04-26 MED ORDER — ROCURONIUM BROMIDE 100 MG/10ML IV SOLN
INTRAVENOUS | Status: DC | PRN
  Administered 2022-04-26: 60 mg via INTRAVENOUS
  Administered 2022-04-26: 10 mg via INTRAVENOUS

## 2022-04-26 MED ORDER — FENTANYL CITRATE (PF) 100 MCG/2ML IJ SOLN: 25.0000 ug | INTRAMUSCULAR | Status: DC | PRN

## 2022-04-26 MED ORDER — CHLORHEXIDINE GLUCONATE 0.12 % MT SOLN
OROMUCOSAL | Status: AC
  Administered 2022-04-26: 15 mL via OROMUCOSAL
  Filled 2022-04-26: qty 15

## 2022-04-26 MED ORDER — PROPOFOL 10 MG/ML IV BOLUS
INTRAVENOUS | Status: AC
  Filled 2022-04-26: qty 20

## 2022-04-26 MED ORDER — FENTANYL CITRATE PF 50 MCG/ML IJ SOSY
PREFILLED_SYRINGE | INTRAMUSCULAR | Status: AC
  Administered 2022-04-26: 50 ug via INTRAVENOUS
  Filled 2022-04-26: qty 1

## 2022-04-26 MED ORDER — SUCCINYLCHOLINE CHLORIDE 200 MG/10ML IV SOSY
PREFILLED_SYRINGE | INTRAVENOUS | Status: DC | PRN
  Administered 2022-04-26: 120 mg via INTRAVENOUS

## 2022-04-26 MED ORDER — MIDAZOLAM HCL 2 MG/2ML IJ SOLN: 1.0000 mg | INTRAMUSCULAR | Status: DC | PRN

## 2022-04-26 MED ORDER — OXYCODONE HCL 5 MG PO TABS: 5.0000 mg | ORAL_TABLET | ORAL | 0 refills | Status: AC | PRN

## 2022-04-26 MED ORDER — DEXMEDETOMIDINE HCL IN NACL 200 MCG/50ML IV SOLN
INTRAVENOUS | Status: DC | PRN
  Administered 2022-04-26: 8 ug via INTRAVENOUS
  Administered 2022-04-26 (×2): 12 ug via INTRAVENOUS

## 2022-04-26 MED ORDER — DEXAMETHASONE SODIUM PHOSPHATE 10 MG/ML IJ SOLN
INTRAMUSCULAR | Status: DC | PRN
  Administered 2022-04-26: 10 mg via INTRAVENOUS

## 2022-04-26 MED ORDER — FAMOTIDINE 20 MG PO TABS
ORAL_TABLET | ORAL | Status: AC
  Administered 2022-04-26: 20 mg via ORAL
  Filled 2022-04-26: qty 1

## 2022-04-26 MED ORDER — PROPOFOL 10 MG/ML IV BOLUS
INTRAVENOUS | Status: DC | PRN
  Administered 2022-04-26: 200 mg via INTRAVENOUS

## 2022-04-26 MED ORDER — NEOMYCIN-POLYMYXIN B GU 40-200000 IR SOLN
Status: AC
  Filled 2022-04-26: qty 20

## 2022-04-26 MED ORDER — BUPIVACAINE HCL (PF) 0.25 % IJ SOLN
INTRAMUSCULAR | Status: AC
  Filled 2022-04-26: qty 30

## 2022-04-26 MED ORDER — BUPIVACAINE LIPOSOME 1.3 % IJ SUSP
INTRAMUSCULAR | Status: AC
  Filled 2022-04-26: qty 20

## 2022-04-26 MED ORDER — LIDOCAINE HCL (CARDIAC) PF 100 MG/5ML IV SOSY
PREFILLED_SYRINGE | INTRAVENOUS | Status: DC | PRN
  Administered 2022-04-26: 20 mg via INTRAVENOUS

## 2022-04-26 MED ORDER — EPINEPHRINE PF 1 MG/ML IJ SOLN
INTRAMUSCULAR | Status: DC | PRN
  Administered 2022-04-26: 4 mL via INTRAMUSCULAR

## 2022-04-26 MED ORDER — FENTANYL CITRATE (PF) 100 MCG/2ML IJ SOLN
INTRAMUSCULAR | Status: DC | PRN
  Administered 2022-04-26: 100 ug via INTRAVENOUS

## 2022-04-26 MED ORDER — FENTANYL CITRATE (PF) 100 MCG/2ML IJ SOLN
INTRAMUSCULAR | Status: AC
  Filled 2022-04-26: qty 2

## 2022-04-26 SURGICAL SUPPLY — 69 items
ADAPTER IRRIG TUBE 2 SPIKE SOL (ADAPTER) ×2 IMPLANT
ADPR TBG 2 SPK PMP STRL ASCP (ADAPTER) ×2
ANCH SUT 5.5 KNTLS PEEK (Orthopedic Implant) ×1 IMPLANT
ANCH SUT Q-FX 2.8 (Anchor) ×1 IMPLANT
ANCHOR ALL-SUT Q-FIX 2.8 (Anchor) ×8 IMPLANT
ANCHOR SUT 5.5 MULTIFIX (Orthopedic Implant) IMPLANT
ANCHOR SUT BIOC ST 3X145 (Anchor) IMPLANT
CANNULA 5.75X7 CRYSTAL CLEAR (CANNULA) ×1 IMPLANT
CANNULA PARTIAL THREAD 2X7 (CANNULA) IMPLANT
CANNULA TWIST IN 8.25X9CM (CANNULA) IMPLANT
CONNECTOR PERFECT PASSER (CONNECTOR) ×1 IMPLANT
COOLER POLAR GLACIER W/PUMP (MISCELLANEOUS) ×1 IMPLANT
DEVICE SUCT BLK HOLE OR FLOOR (MISCELLANEOUS) ×2 IMPLANT
DRAPE 3/4 80X56 (DRAPES) ×1 IMPLANT
DRAPE U-SHAPE 47X51 STRL (DRAPES) ×1 IMPLANT
DURAPREP 26ML APPLICATOR (WOUND CARE) ×3 IMPLANT
ELECT REM PT RETURN 9FT ADLT (ELECTROSURGICAL) ×1
ELECTRODE REM PT RTRN 9FT ADLT (ELECTROSURGICAL) ×1 IMPLANT
GAUZE SPONGE 4X4 12PLY STRL (GAUZE/BANDAGES/DRESSINGS) ×1 IMPLANT
GAUZE XEROFORM 1X8 LF (GAUZE/BANDAGES/DRESSINGS) ×1 IMPLANT
GLOVE BIOGEL PI IND STRL 9 (GLOVE) ×1 IMPLANT
GLOVE BIOGEL PI ORTHO SZ9 (GLOVE) ×6 IMPLANT
GOWN STRL REUS TWL 2XL XL LVL4 (GOWN DISPOSABLE) ×1 IMPLANT
GOWN STRL REUS W/ TWL LRG LVL3 (GOWN DISPOSABLE) ×1 IMPLANT
GOWN STRL REUS W/TWL LRG LVL3 (GOWN DISPOSABLE) ×1
IV LACTATED RINGER IRRG 3000ML (IV SOLUTION) ×4
IV LR IRRIG 3000ML ARTHROMATIC (IV SOLUTION) ×8 IMPLANT
KIT STABILIZATION SHOULDER (MISCELLANEOUS) ×1 IMPLANT
KIT SUTURE 2.8 Q-FIX DISP (MISCELLANEOUS) ×1 IMPLANT
KIT SUTURETAK 3.0 INSERT PERC (KITS) IMPLANT
KIT TURNOVER KIT A (KITS) ×1 IMPLANT
MANIFOLD NEPTUNE II (INSTRUMENTS) ×1 IMPLANT
MASK FACE SPIDER DISP (MASK) ×1 IMPLANT
MAT ABSORB  FLUID 56X50 GRAY (MISCELLANEOUS) ×2
MAT ABSORB FLUID 56X50 GRAY (MISCELLANEOUS) ×2 IMPLANT
NDL SAFETY ECLIP 18X1.5 (MISCELLANEOUS) ×1 IMPLANT
NEEDLE HYPO 22GX1.5 SAFETY (NEEDLE) ×1 IMPLANT
PACK ARTHROSCOPY SHOULDER (MISCELLANEOUS) ×1 IMPLANT
PAD ABD DERMACEA PRESS 5X9 (GAUZE/BANDAGES/DRESSINGS) ×1 IMPLANT
PAD ARMBOARD 7.5X6 YLW CONV (MISCELLANEOUS) ×2 IMPLANT
PAD WRAPON POLAR SHDR XLG (MISCELLANEOUS) ×1 IMPLANT
PASSER SUT FIRSTPASS SELF (INSTRUMENTS) ×1 IMPLANT
SHAVER BLADE BONE CUTTER 4.5 (BLADE) ×1 IMPLANT
SHAVER BLADE TAPERED BLUNT 4 (BLADE) ×1 IMPLANT
SLEEVE REMOTE CONTROL 5X12 (DRAPES) ×1 IMPLANT
SPONGE T-LAP 18X18 ~~LOC~~+RFID (SPONGE) ×1 IMPLANT
STRIP CLOSURE SKIN 1/2X4 (GAUZE/BANDAGES/DRESSINGS) ×1 IMPLANT
SUT ETHILON 4-0 (SUTURE) ×1
SUT ETHILON 4-0 FS2 18XMFL BLK (SUTURE) ×1
SUT LASSO 90 DEG SD STR (SUTURE) IMPLANT
SUT MNCRL 4-0 (SUTURE) ×1
SUT MNCRL 4-0 27XMFL (SUTURE) ×1
SUT PDS AB 0 CT1 27 (SUTURE) ×3 IMPLANT
SUT PERFECTPASSER WHITE CART (SUTURE) ×4 IMPLANT
SUT SMART STITCH CARTRIDGE (SUTURE) ×4 IMPLANT
SUT ULTRABRAID 2 COBRAID 38 (SUTURE) IMPLANT
SUT VIC AB 0 CT1 36 (SUTURE) ×3 IMPLANT
SUT VIC AB 2-0 CT2 27 (SUTURE) ×1 IMPLANT
SUTURE ETHLN 4-0 FS2 18XMF BLK (SUTURE) ×1 IMPLANT
SUTURE MNCRL 4-0 27XMF (SUTURE) ×1 IMPLANT
SYR 10ML LL (SYRINGE) ×1 IMPLANT
TAPE MICROFOAM 4IN (TAPE) ×1 IMPLANT
TRAP FLUID SMOKE EVACUATOR (MISCELLANEOUS) ×1 IMPLANT
TUBING CONNECTING 10 (TUBING) ×1 IMPLANT
TUBING INFLOW SET DBFLO PUMP (TUBING) ×1 IMPLANT
TUBING OUTFLOW SET DBLFO PUMP (TUBING) ×1 IMPLANT
WAND WEREWOLF FLOW 90D (MISCELLANEOUS) ×1 IMPLANT
WATER STERILE IRR 500ML POUR (IV SOLUTION) ×1 IMPLANT
WRAPON POLAR PAD SHDR XLG (MISCELLANEOUS)

## 2022-04-26 NOTE — Transfer of Care (Signed)
Immediate Anesthesia Transfer of Care Note  Patient: Brett Robinson  Procedure(s) Performed: ARTHROSCOPY SHOULDER (Left: Shoulder) OPEN ROTATOR CUFF REPAIR (Left: Shoulder)  Patient Location: PACU  Anesthesia Type:General  Level of Consciousness: awake, drowsy, and patient cooperative  Airway & Oxygen Therapy: Patient Spontanous Breathing and Patient connected to face mask oxygen  Post-op Assessment: Report given to RN and Post -op Vital signs reviewed and stable  Post vital signs: Reviewed and stable  Last Vitals:  Vitals Value Taken Time  BP 91/53 04/26/22 1015  Temp 36.8 C 04/26/22 1013  Pulse 58 04/26/22 1019  Resp 22 04/26/22 1019  SpO2 98 % 04/26/22 1019  Vitals shown include unvalidated device data.  Last Pain:  Vitals:   04/26/22 0612  TempSrc: Oral  PainSc: 7          Complications: No notable events documented.

## 2022-04-26 NOTE — Anesthesia Preprocedure Evaluation (Addendum)
Anesthesia Evaluation  Patient identified by MRN, date of birth, ID band Patient awake    Reviewed: Allergy & Precautions, H&P , NPO status , Patient's Chart, lab work & pertinent test results, reviewed documented beta blocker date and time   History of Anesthesia Complications Negative for: history of anesthetic complications  Airway Mallampati: III  TM Distance: >3 FB Neck ROM: full  Mouth opening: Limited Mouth Opening  Dental  (+) Dental Advidsory Given, Missing, Poor Dentition   Pulmonary neg shortness of breath, sleep apnea (patient has never been tested, but is sure he has this) , neg COPD, neg recent URI, former smoker   Pulmonary exam normal breath sounds clear to auscultation       Cardiovascular Exercise Tolerance: Good negative cardio ROS Normal cardiovascular exam Rhythm:regular Rate:Normal     Neuro/Psych neg Seizures PSYCHIATRIC DISORDERS Anxiety      Neuromuscular disease (pinched nerve in neck)    GI/Hepatic Neg liver ROS,GERD  Controlled,,  Endo/Other  negative endocrine ROS    Renal/GU negative Renal ROS  negative genitourinary   Musculoskeletal   Abdominal   Peds  Hematology negative hematology ROS (+)   Anesthesia Other Findings Past Medical History: No date: Anxiety     Comment:  For a while because of medical issues that no one can               figure out No date: GERD (gastroesophageal reflux disease) No date: Headache No date: Neuromuscular disorder ([place])     Comment:  Not sure on the date No date: Sleep apnea   Reproductive/Obstetrics negative OB ROS                             Anesthesia Physical Anesthesia Plan  ASA: 2  Anesthesia Plan: General   Post-op Pain Management: Regional block*   Induction: Intravenous  PONV Risk Score and Plan: 2 and Ondansetron, Dexamethasone, Midazolam and Treatment may vary due to age or medical condition  Airway  Management Planned: Oral ETT  Additional Equipment:   Intra-op Plan:   Post-operative Plan: Extubation in OR  Informed Consent: I have reviewed the patients History and Physical, chart, labs and discussed the procedure including the risks, benefits and alternatives for the proposed anesthesia with the patient or authorized representative who has indicated his/her understanding and acceptance.     Dental Advisory Given  Plan Discussed with: Anesthesiologist, CRNA and Surgeon  Anesthesia Plan Comments:        Anesthesia Quick Evaluation
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
## 2022-04-26 NOTE — Discharge Instructions (Addendum)
AMBULATORY SURGERY  DISCHARGE INSTRUCTIONS   The drugs that you were given will stay in your system until tomorrow so for the next 24 hours you should not:  Drive an automobile Make any legal decisions Drink any alcoholic beverage   You may resume regular meals tomorrow.  Today it is better to start with liquids and gradually work up to solid foods.  You may eat anything you prefer, but it is better to start with liquids, then soup and crackers, and gradually work up to solid foods.   Please notify your doctor immediately if you have any unusual bleeding, trouble breathing, redness and pain at the surgery site, drainage, fever, or pain not relieved by medication.    Additional Instructions:  PLEASE LEAVE GREEN ARMBAND ON FOR 4 DAYS    Please contact your physician with any problems or Same Day Surgery at [phone], Monday through Friday 6 am to 4 pm, or [place] at [facility] number at [phone].     Interscalene Nerve Block with Exparel   For your surgery you have received an Interscalene Nerve Block with Exparel. Nerve Blocks affect many types of nerves, including nerves that control movement, pain and normal sensation.  You may experience feelings such as numbness, tingling, heaviness, weakness or the inability to move your arm or the feeling or sensation that your arm has "fallen asleep". A nerve block with Exparel can last up to 5 days.  Usually the weakness wears off first.  The tingling and heaviness usually wear off next.  Finally you may start to notice pain.  Keep in mind that this may occur in any order.  Once a nerve block starts to wear off it is usually completely gone within 60 minutes. ISNB may cause mild shortness of breath, a hoarse voice, blurry vision, unequal pupils, or drooping of the face on the same side as the nerve block.  These symptoms will usually resolve with the numbness.  Very rarely the procedure itself can cause mild seizures. If needed,  your surgeon will give you a prescription for pain medication.  It will take about 60 minutes for the oral pain medication to become fully effective.  So, it is recommended that you start taking this medication before the nerve block first begins to wear off, or when you first begin to feel discomfort. Take your pain medication only as prescribed.  Pain medication can cause sedation and decrease your breathing if you take more than you need for the level of pain that you have. Nausea is a common side effect of many pain medications.  You may want to eat something before taking your pain medicine to prevent nausea. After an Interscalene nerve block, you cannot feel pain, pressure or extremes in temperature in the effected arm.  Because your arm is numb it is at an increased risk for injury.  To decrease the possibility of injury, please practice the following:  While you are awake change the position of your arm frequently to prevent too much pressure on any one area for prolonged periods of time.  If you have a cast or tight dressing, check the color or your fingers every couple of hours.  Call your surgeon with the appearance of any discoloration (white or blue). If you are given a sling to wear before you go home, please wear it  at all times until the block has completely worn off.  Do not get up at night without your sling. Please contact [facility]  or your surgeon if you do not begin to regain sensation after 7 days from the surgery.  Anesthesia may be contacted by calling the Same Day Surgery Department, Mon. through Fri., 6 am to 4 pm at [phone].   If you experience any other problems or concerns, please contact your surgeon's office. If you experience severe or prolonged shortness of breath go to the nearest emergency department.  POLAR CARE INFORMATION  MassAdvertisement.it  How to use Breg Polar Care [facility] Therapy System?  YouTube    ShippingScam.co.uk  OPERATING INSTRUCTIONS  Start the product With dry hands, connect the transformer to the electrical connection located on the top of the cooler. Next, plug the transformer into an appropriate electrical outlet. The unit will automatically start running at this point.  To stop the pump, disconnect electrical power.  Unplug to stop the product when not in use. Unplugging the Polar Care unit turns it off. Always unplug immediately after use. Never leave it plugged in while unattended. Remove pad.    FIRST ADD WATER TO FILL LINE, THEN ICE---Replace ice when existing ice is almost melted  1 Discuss Treatment with your Licensed Health Care Practitioner and Use Only as Prescribed 2 Apply Insulation Barrier & Cold Therapy Pad 3 Check for Moisture 4 Inspect Skin Regularly  Tips and Trouble Shooting Usage Tips 1. Use cubed or chunked ice for optimal performance. 2. It is recommended to drain the Pad between uses. To drain the pad, hold the Pad upright with the hose pointed toward the ground. Depress the black plunger and allow water to drain out. 3. You may disconnect the Pad from the unit without removing the pad from the affected area by depressing the silver tabs on the hose coupling and gently pulling the hoses apart. The Pad and unit will seal itself and will not leak. Note: Some dripping during release is normal. 4. DO NOT RUN PUMP WITHOUT WATER! The pump in this unit is designed to run with water. Running the unit without water will cause permanent damage to the pump. 5. Unplug unit before removing lid.  TROUBLESHOOTING GUIDE Pump not running, Water not flowing to the pad, Pad is not getting cold 1. Make sure the transformer is plugged into the wall outlet. 2. Confirm that the ice and water are filled to the indicated levels. 3. Make sure there are no kinks in the pad. 4. Gently pull on the blue tube to make sure the tube/pad junction is  straight. 5. Remove the pad from the treatment site and ll it while the pad is lying at; then reapply. 6. Confirm that the pad couplings are securely attached to the unit. Listen for the double clicks (Figure 1) to confirm the pad couplings are securely attached.  Leaks    Note: Some condensation on the lines, controller, and pads is unavoidable, especially in warmer climates. 1. If using a Breg Polar Care Cold Therapy unit with a detachable Cold Therapy Pad, and a leak exists (other than condensation on the lines) disconnect the pad couplings. Make sure the silver tabs on the couplings are depressed before reconnecting the pad to the pump hose; then confirm both sides of the coupling are properly clicked in. 2. If the coupling continues to leak or a leak is detected in the pad itself, stop using it and call [employer] Customer Care at [phone].  Cleaning After use, empty and dry the unit with a soft cloth. Warm water and mild detergent may be used  occasionally to clean the pump and tubes.  WARNING: The [place] can be cold enough to cause serious injury, including full skin necrosis. Follow these Operating Instructions, and carefully read the Product Insert (see pouch on side of unit) and the Cold Therapy Pad Fitting Instructions (provided with each Cold Therapy Pad) prior to use.  SHOULDER SLING IMMOBILIZER   VIDEO Slingshot 2 Shoulder Brace Application - YouTube ---https://www.willis-schwartz.biz/  INSTRUCTIONS While supporting the injured arm, slide the forearm into the sling. Wrap the adjustable shoulder strap around the neck and shoulders and attach the strap end to the sling using  the "alligator strap tab."  Adjust the shoulder strap to the required length. Position the shoulder pad behind the neck. To secure the shoulder pad location (optional), pull the shoulder strap away from the shoulder pad, unfold the hook material on the top of the pad, then press the shoulder  strap back onto the hook material to secure the pad in place. Attach the closure strap across the open top of the sling. Position the strap so that it holds the arm securely in the sling. Next, attach the thumb strap to the open end of the sling between the thumb and fingers. After sling has been fit, it may be easily removed and reapplied using the quick release buckle on shoulder strap. If a neutral pillow or 15 abduction pillow is included, place the pillow at the waistline. Attach the sling to the pillow, lining up hook material on the pillow with the loop on sling. Adjust the waist strap to fit.  If waist strap is too long, cut it to fit. Use the small piece of double sided hook material (located on top of the pillow) to secure the strap end. Place the double sided hook material on the inside of the cut strap end and secure it to the waist strap.     If no pillow is included, attach the waist strap to the sling and adjust to fit.    Washing Instructions: Straps and sling must be removed and cleaned regularly depending on your activity level and perspiration. Hand wash straps and sling in cold water with mild detergent, rinse, air dry
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
## 2022-04-26 NOTE — Op Note (Addendum)
04/26/2022  10:21 AM  PATIENT:  Brett Robinson  37 y.o. male  PRE-OPERATIVE DIAGNOSIS:  Left Shoulder Rotator Cuff Tear  POST-OPERATIVE DIAGNOSIS:  Left Shoulder Rotator Cuff Tear, superior labral fraying and subacromial impingement  PROCEDURE:  Left shoulder arthroscopic subacromial decompression and superior labral debridement with mini-open rotator cuff repair  SURGEON:  Surgeon(s) and Role:    Thornton Park, MD - Primary  ANESTHESIA:   general and paracervical block   PREOPERATIVE INDICATIONS:  Brett Robinson is a  37 y.o. male with a diagnosis of Left Shoulder Rotator Cuff Tear failed conservative treatment and elected for surgical management.    The risks benefits and alternatives were discussed with the patient preoperatively including but not limited to the risks of infection, bleeding, nerve injury, persistent pain or weakness, shoulder stiffness/arthrofibrosis, failure of the repair, re-tear of the rotator cuff and the need for further surgery. Medical risks include DVT and pulmonary embolism, myocardial infarction, stroke, pneumonia, respiratory failure and death. Patient understood these risks and wished to proceed.  OPERATIVE IMPLANTS: [place] Multifix anchor x 1 & [employer] Q Fix anchors x 1  OPERATIVE FINDINGS: Full-thickness supraspinatus tear  OPERATIVE PROCEDURE: The patient was met in the preoperative area. The left shoulder was signed with the word yes and my initials according the hospital's correct site of surgery protocol.   A pre-op history and physical was performed at the bedside.   Patient underwent an interscalene block with Exparel by the anesthesia service.  Patient was brought to the operating room where he underwent general anesthesia.  The patient was placed in a beachchair position.  A spider arm positioner was used for this case.  Examination under anesthesia revealed no loss of passive range of motion or instability with load shift  testing. The patient had a negative sulcus sign.  Patient was prepped and draped in a sterile fashion. A timeout was performed to verify the patient's name, date of birth, medical record number, correct site of surgery and correct procedure to be performed there was also used to verify the patient received antibiotics that all appropriate instruments, implants and radiographs studies were available in the room. Once all in attendance were in agreement case began.  Patient received ancef 2 grams IV for pre-op antibiotics.  Bony landmarks were drawn out with a surgical marker along with proposed arthroscopy incisions.  An 11 blade was used to establish a posterior portal through which the arthroscope was placed in the glenohumeral joint. A full diagnostic examination of the shoulder was performed.  The superior labrum was found to be frayed without SLAP tear.  It was debrided with a tapered shaver blade.  The anterior labrum was also frayed and debrided by no Bankart lesion was found with probing.    The arthroscope was then placed in the subacromial space.  Bursitis was encountered in the subacromial space and debrided using a tapered shaver blade and a 90 ArthroCare wand from a lateral portal which was established under direct visualization using an 18-gauge spinal needle. A subacromial decompression was performed sing a 4.5 mm bone cutter shaver blade from the lateral portal. The 4.5 mm bone cutter shaver blade was then used to debride the greater tuberosity of all torn fibers of the rotator cuff.  A single Perfect Pass suture was placed in the lateral border of the rotator cuff tear. All arthroscopic instruments were then removed and the mini-open portion of the procedure began.  A saber-type incision was made  along the lateral border of the acromion. The deltoid muscle was identified and split in line with its fibers which allowed visualization of the rotator cuff. The Perfect Pass sutures previously  placed in the lateral border of the rotator cuff were brought out through the deltoid split. The lateral edge of the rotator cuff was debrided with a #15 blade until healthy cuff tissue was observed.  A single [employer] anchor was placed at the articular margin of the humeral head with the greater tuberosity. The suture limbs of the Q Fix anchor were passed medially through the rotator cuff using a First Pass suture passer.   Two additional Perfect Pass sutures were then placed in the lateral border of the rotator cuff.  The Perfect Pass sutures were then anchored to the greater tuberosity footprint using a single [employer] Multifix anchor. The sutures passed through the Multifix anchor were then tensioned to allow reduction of the rotator cuff to the greater tuberosity footprint. The medial row repair was then performed using the Q fix sutures, which were tied down using an arthroscopic knot tying technique.  Arthroscopic images of the repair were taken with the arthroscope both externally and from inside the glenohumeral joint.  All incisions were copiously irrigated. The deltoid fascia was repaired using a 0 Vicryl suture.  The subcutaneous tissue of all incisions were closed with a 2-0 Vicryl. Skin closure for the arthroscopic incisions was performed with 4-0 nylon. The skin edges of the saber incision was approximated with a running 4-0 undyed Monocryl.  A dry sterile dressing was applied.  The patient was placed in an abduction sling and a Polar Care was applied to the shoulder.  All sharp, sponge and it instrument counts were correct at the conclusion of the case. I was scrubbed and present for the entire case. Contact could not be made with the patient's significant other by phone from the PACU despite multiple attempts.  Patient's contact was not in the waiting room.  Voicemail on the phone was not set-up.

## 2022-04-26 NOTE — Anesthesia Procedure Notes (Addendum)
Anesthesia Regional Block: Interscalene brachial plexus block   Pre-Anesthetic Checklist: , timeout performed,  Correct Patient, Correct Site, Correct Laterality,  Correct Procedure, Correct Position, site marked,  Risks and benefits discussed,  Surgical consent,  Pre-op evaluation,  At surgeon's request and post-op pain management  Laterality: Left and Upper  Prep: chloraprep       Needles:  Injection technique: Single-shot  Needle Type: Stimiplex     Needle Length: 5cm  Needle Gauge: 22     Additional Needles:   Procedures:,,,, ultrasound used (permanent image in chart),,    Narrative:  Start time: 04/26/2022 7:51 AM End time: 04/26/2022 7:54 AM Injection made incrementally with aspirations every 5 mL.  Performed by: Personally  Anesthesiologist: Martha Clan, MD  Additional Notes: Functioning IV was confirmed and monitors were applied.  A 37mm 22ga Stimuplex needle was used. Sterile prep and drape,hand hygiene and sterile gloves were used.  Negative aspiration and negative test dose prior to incremental administration of local anesthetic. The patient tolerated the procedure well.

## 2022-04-26 NOTE — Anesthesia Procedure Notes (Signed)
Procedure Name: Intubation Date/Time: 04/26/2022 8:04 AM  Performed by: Kelton Pillar, CRNAPre-anesthesia Checklist: Patient identified, Emergency Drugs available, Suction available and Patient being monitored Patient Re-evaluated:Patient Re-evaluated prior to induction Oxygen Delivery Method: Circle system utilized Preoxygenation: Pre-oxygenation with 100% oxygen Induction Type: IV induction Ventilation: Mask ventilation without difficulty Laryngoscope Size: McGraph and 3 Grade View: Grade I Tube type: Oral Tube size: 7.0 mm Number of attempts: 1 Airway Equipment and Method: Stylet and Oral airway Placement Confirmation: ETT inserted through vocal cords under direct vision, positive ETCO2, breath sounds checked- equal and bilateral and CO2 detector Secured at: 21 cm Tube secured with: Tape Dental Injury: Teeth and Oropharynx as per pre-operative assessment

## 2022-04-28 NOTE — Anesthesia Postprocedure Evaluation (Signed)
Anesthesia Post Note  Patient: Brett Robinson  Procedure(s) Performed: ARTHROSCOPY SHOULDER (Left: Shoulder) OPEN ROTATOR CUFF REPAIR (Left: Shoulder)  Patient location during evaluation: PACU Anesthesia Type: General Level of consciousness: awake and alert Pain management: pain level controlled Vital Signs Assessment: post-procedure vital signs reviewed and stable Respiratory status: spontaneous breathing, nonlabored ventilation, respiratory function stable and patient connected to nasal cannula oxygen Cardiovascular status: blood pressure returned to baseline and stable Postop Assessment: no apparent nausea or vomiting Anesthetic complications: no   No notable events documented.   Last Vitals:  Vitals:   04/26/22 1100 04/26/22 1118  BP: 100/67 109/68  Pulse: 68 76  Resp: 19 20  Temp: 36.4 C (!) 36.4 C  SpO2: 96% 96%    Last Pain:  Vitals:   04/26/22 1118  TempSrc: Temporal  PainSc: 0-No pain                 Martha Clan
